# Patient Record
Sex: Male | Born: 1990 | Race: Black or African American | Hispanic: No | Smoking: Current some day smoker
Health system: Southern US, Community
[De-identification: ages and names within clinical notes are randomized; demographics above are authoritative.]

## PROBLEM LIST (undated history)

## (undated) DIAGNOSIS — F909 Attention-deficit hyperactivity disorder, unspecified type: Secondary | ICD-10-CM

## (undated) DIAGNOSIS — S060XAA Concussion with loss of consciousness status unknown, initial encounter: Secondary | ICD-10-CM

## (undated) DIAGNOSIS — S060X9A Concussion with loss of consciousness of unspecified duration, initial encounter: Secondary | ICD-10-CM

## (undated) HISTORY — PX: WRIST FRACTURE SURGERY: SHX121

## (undated) HISTORY — PX: WRIST SURGERY: SHX841

---
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
## 2011-01-11 LAB — POCT HEMOGLOBIN-HEMACUE: Hemoglobin: 14 g/dL (ref 12.0–16.0)

## 2011-10-31 ENCOUNTER — Emergency Department (HOSPITAL_COMMUNITY)
Admission: EM | Admit: 2011-10-31 | Discharge: 2011-10-31 | Disposition: A | Discharge status: Home / Self Care | Payer: Medicaid Other | Attending: Emergency Medicine | Admitting: Emergency Medicine

## 2011-10-31 ENCOUNTER — Encounter: Payer: Self-pay | Admitting: Emergency Medicine

## 2011-10-31 ENCOUNTER — Emergency Department (HOSPITAL_COMMUNITY): Payer: Medicaid Other

## 2011-10-31 DIAGNOSIS — M25579 Pain in unspecified ankle and joints of unspecified foot: Secondary | ICD-10-CM | POA: Insufficient documentation

## 2011-10-31 DIAGNOSIS — Z79899 Other long term (current) drug therapy: Secondary | ICD-10-CM | POA: Insufficient documentation

## 2011-10-31 DIAGNOSIS — M79609 Pain in unspecified limb: Secondary | ICD-10-CM | POA: Insufficient documentation

## 2011-10-31 DIAGNOSIS — X58XXXA Exposure to other specified factors, initial encounter: Secondary | ICD-10-CM | POA: Insufficient documentation

## 2011-10-31 DIAGNOSIS — F909 Attention-deficit hyperactivity disorder, unspecified type: Secondary | ICD-10-CM | POA: Insufficient documentation

## 2011-10-31 DIAGNOSIS — S99921A Unspecified injury of right foot, initial encounter: Secondary | ICD-10-CM

## 2011-10-31 DIAGNOSIS — F17200 Nicotine dependence, unspecified, uncomplicated: Secondary | ICD-10-CM | POA: Insufficient documentation

## 2011-10-31 DIAGNOSIS — Y9361 Activity, american tackle football: Secondary | ICD-10-CM | POA: Insufficient documentation

## 2011-10-31 DIAGNOSIS — S8990XA Unspecified injury of unspecified lower leg, initial encounter: Secondary | ICD-10-CM | POA: Insufficient documentation

## 2011-10-31 HISTORY — DX: Attention-deficit hyperactivity disorder, unspecified type: F90.9

## 2011-10-31 MED ORDER — IBUPROFEN 600 MG PO TABS: 600.0000 mg | ORAL_TABLET | Freq: Four times a day (QID) | ORAL | Status: AC | PRN

## 2011-10-31 NOTE — ED Notes (Signed)
Pt c/o right foot pain after playing football yesterday; pt sts some swelling; pt ambulatory and CMS intact

## 2011-10-31 NOTE — ED Notes (Signed)
Ortho paged. 

## 2011-10-31 NOTE — ED Provider Notes (Signed)
History     CSN: [id number]  Arrival date & time 10/31/11  0740   First MD Initiated Contact with Patient 10/31/11 [phone]      Chief Complaint  Patient presents with  . Foot Pain    (Consider location/radiation/quality/duration/timing/severity/associated sxs/prior treatment) HPI  21 year old male presenting to the ED with chief complaints of right foot pain. Patient states he was playing football yesterday and has noticed pain to this right foot. Patient describes pain as sharp, throbbing. He is able to ambulate but had increasing pain with walking. He denies any specific injury. He denies right knee or ankle pain, and denies numbness or bleeding.  He is having increasing pain this morning.  Past Medical History  Diagnosis Date  . ADHD (attention deficit hyperactivity disorder)     Past Surgical History  Procedure Date  . Wrist fracture surgery     History reviewed. No pertinent family history.  History  Substance Use Topics  . Smoking status: Current Some Day Smoker -- 2.0 packs/day    Types: Cigarettes  . Smokeless tobacco: Not on file  . Alcohol Use: No      Review of Systems  All other systems reviewed and are negative.    Allergies  Review of patient's allergies indicates no known allergies.  Home Medications   Current Outpatient Rx  Name Route Sig Dispense Refill  . METHYLPHENIDATE HCL ER 36 MG PO TBCR Oral Take 36 mg by mouth every morning.      Marland Kitchen MIRTAZAPINE 15 MG PO TABS Oral Take 15 mg by mouth at bedtime.        BP 131/70  Pulse 64  Temp(Src) 97.9 F (36.6 C) (Oral)  Resp 18  SpO2 100%  Physical Exam  Constitutional: He appears well-developed and well-nourished.  HENT:  Head: Normocephalic and atraumatic.  Eyes: Conjunctivae are normal.  Neck: Neck supple.  Musculoskeletal: Normal range of motion. He exhibits tenderness.       Right knee: Normal.       Right ankle: He exhibits normal range of motion, no swelling and no deformity.  tenderness.       Feet:  Neurological: He is alert.  Skin: Skin is warm and dry. No rash noted. No erythema.    ED Course  Procedures (including critical care time)  Labs Reviewed - No data to display No results found.   No diagnosis found.  Results for orders placed during the hospital encounter of 02/09/10  POCT HEMOGLOBIN-HEMACUE      Component Value Range   Hemoglobin 14.0  12.0 - 16.0 (g/dL)   Dg Ankle Complete Right  10/31/2011  *RADIOLOGY REPORT*  Clinical Data: Lateral ankle pain radiating to the top of the foot. Fall.  RIGHT ANKLE - COMPLETE 3+ VIEW  Comparison: None.  Findings: Plafond and talar dome appear intact.  No soft tissue swelling along the malleoli noted.  No malleolar fracture is observed.  IMPRESSION:  1.  No acute bony findings.  If symptoms persist despite conservative therapy, MRI followup may be warranted.  Original Report Authenticated By: Dellia Cloud, M.D.   Dg Foot Complete Right  10/31/2011  *RADIOLOGY REPORT*  Clinical Data: Fall.  Foot and ankle pain.  RIGHT FOOT COMPLETE - 3+ VIEW  Comparison: None.  Findings: Alignment at the Lisfranc joint appears normal.  The base of the fifth metatarsal appears intact.  Hallux valgus noted, with a bifid medial first digit sesamoid.  No metatarsal fracture is observed.  IMPRESSION:  1.  Hallux valgus.   Otherwise, no significant abnormality identified.  Original Report Authenticated By: Dellia Cloud, M.D.      MDM  Will obtain R ankle and foot xray due to pain after playing football.  Pt currently able to ambulate.  Exam is noted for tenderness at R 5th MCP.     9:36 AM Right ankle and foot x-ray reveals no acute fractures or dislocation. Patient is able to date. Ace wrap and postop shoe and order. R.I.C.E therapy discussed. Follow up instruction given. Patient agrees with plan.     Fayrene Helper, [place] 10/31/11 [phone]

## 2011-10-31 NOTE — ED Provider Notes (Signed)
Medical screening examination/treatment/procedure(s) were performed by non-physician practitioner and as supervising physician I was immediately available for consultation/collaboration.  Kaire Stary, MD 10/31/11 1756 

## 2013-03-29 ENCOUNTER — Encounter (HOSPITAL_COMMUNITY): Payer: Self-pay | Admitting: *Deleted

## 2013-03-29 ENCOUNTER — Emergency Department (HOSPITAL_COMMUNITY): Payer: Medicaid Other

## 2013-03-29 ENCOUNTER — Emergency Department (HOSPITAL_COMMUNITY)
Admission: EM | Admit: 2013-03-29 | Discharge: 2013-03-30 | Disposition: A | Discharge status: Home / Self Care | Payer: Medicaid Other | Attending: Emergency Medicine | Admitting: Emergency Medicine

## 2013-03-29 DIAGNOSIS — F17200 Nicotine dependence, unspecified, uncomplicated: Secondary | ICD-10-CM | POA: Insufficient documentation

## 2013-03-29 DIAGNOSIS — Y9289 Other specified places as the place of occurrence of the external cause: Secondary | ICD-10-CM | POA: Insufficient documentation

## 2013-03-29 DIAGNOSIS — IMO0002 Reserved for concepts with insufficient information to code with codable children: Secondary | ICD-10-CM | POA: Insufficient documentation

## 2013-03-29 DIAGNOSIS — S0990XA Unspecified injury of head, initial encounter: Secondary | ICD-10-CM | POA: Insufficient documentation

## 2013-03-29 DIAGNOSIS — S0100XA Unspecified open wound of scalp, initial encounter: Secondary | ICD-10-CM | POA: Insufficient documentation

## 2013-03-29 DIAGNOSIS — Z8659 Personal history of other mental and behavioral disorders: Secondary | ICD-10-CM | POA: Insufficient documentation

## 2013-03-29 DIAGNOSIS — Y9351 Activity, roller skating (inline) and skateboarding: Secondary | ICD-10-CM | POA: Insufficient documentation

## 2013-03-29 DIAGNOSIS — S01501A Unspecified open wound of lip, initial encounter: Secondary | ICD-10-CM | POA: Insufficient documentation

## 2013-03-29 DIAGNOSIS — S01511A Laceration without foreign body of lip, initial encounter: Secondary | ICD-10-CM

## 2013-03-29 LAB — CBC
Hemoglobin: 15.5 g/dL (ref 13.0–17.0)
MCH: 28.8 pg (ref 26.0–34.0)
RBC: 5.39 MIL/uL (ref 4.22–5.81)

## 2013-03-29 MED ORDER — SODIUM CHLORIDE 0.9 % IV BOLUS (SEPSIS)
1000.0000 mL | Freq: Once | INTRAVENOUS | Status: AC
  Administered 2013-03-29: 1000 mL via INTRAVENOUS

## 2013-03-29 NOTE — ED Provider Notes (Signed)
History     CSN: [id number]  Arrival date & time 03/29/13  2046   First MD Initiated Contact with Patient 03/29/13 2051      Chief Complaint  Patient presents with  . Fall    (Consider location/radiation/quality/duration/timing/severity/associated sxs/prior treatment) Patient is a 22 y.o. male presenting with fall.  Fall This is a new problem. The current episode started today. Episode frequency: once. The problem has been unchanged. Associated symptoms include headaches. Pertinent negatives include no abdominal pain, arthralgias, change in bowel habit, chest pain, chills, congestion, coughing, fatigue, fever, joint swelling, nausea, rash, sore throat, urinary symptoms, vertigo, visual change or vomiting. Nothing aggravates the symptoms. He has tried nothing for the symptoms.    Past Medical History  Diagnosis Date  . ADHD (attention deficit hyperactivity disorder)     Past Surgical History  Procedure Laterality Date  . Wrist fracture surgery      No family history on file.  History  Substance Use Topics  . Smoking status: Current Some Day Smoker -- 2.00 packs/day    Types: Cigarettes  . Smokeless tobacco: Not on file  . Alcohol Use: Yes      Review of Systems  Constitutional: Negative for fever, chills and fatigue.  HENT: Negative for congestion, sore throat and rhinorrhea.   Eyes: Negative for photophobia and visual disturbance.  Respiratory: Negative for cough and shortness of breath.   Cardiovascular: Negative for chest pain and leg swelling.  Gastrointestinal: Negative for nausea, vomiting, abdominal pain, diarrhea, constipation and change in bowel habit.  Endocrine: Negative for polydipsia and polyuria.  Genitourinary: Negative for dysuria and hematuria.  Musculoskeletal: Negative for back pain, joint swelling and arthralgias.  Skin: Negative for color change and rash.  Neurological: Positive for headaches. Negative for dizziness, vertigo, syncope and  light-headedness.  Hematological: Negative for adenopathy. Does not bruise/bleed easily.  All other systems reviewed and are negative.    Allergies  Review of patient's allergies indicates no known allergies.  Home Medications   No current outpatient prescriptions on file.  BP 140/75  Pulse 68  Temp(Src) 98.3 F (36.8 C) (Oral)  Resp 15  SpO2 100%  Physical Exam  Vitals reviewed. Constitutional: He is oriented to person, place, and time. He appears well-developed and well-nourished.  HENT:  Head: Normocephalic. Head is with laceration.    Laceration to R inferior interior lip 1cm 5 mm deep, abrasions to face.  No septal hematoma or epistaxis.  Pt has chipped teeth to front central and lateral incisors.  Also with facial tenderness over R maxilla, subjective numbness.  EOMI, PERRL  Eyes: Conjunctivae and EOM are normal. Pupils are equal, round, and reactive to light.  Neck: Normal range of motion. Neck supple.  Cardiovascular: Normal rate, regular rhythm and normal heart sounds.   Pulmonary/Chest: Effort normal and breath sounds normal. No respiratory distress.  Abdominal: He exhibits no distension. There is no tenderness. There is no rebound and no guarding.  Musculoskeletal: Normal range of motion.       Left shoulder: He exhibits tenderness and bony tenderness.       Cervical back: He exhibits tenderness and bony tenderness.       Thoracic back: Normal.       Lumbar back: He exhibits tenderness and bony tenderness.  Abrasions over bil shoulders  Neurological: He is alert and oriented to person, place, and time.  Skin: Skin is warm and dry.    ED Course  LACERATION REPAIR Date/Time: 03/30/2013 2:29 AM  Performed by: Noel Gerold Authorized by: Noel Gerold Consent: Verbal consent obtained. Body area: head/neck Location details: lower lip Full thickness lip laceration: yes Vermillion border involved: yes Lip laceration height: vermillion only Laceration length: 4  cm Foreign bodies: no foreign bodies Tendon involvement: none Nerve involvement: none Anesthesia: local infiltration Local anesthetic: lidocaine 1% without epinephrine Irrigation solution: saline Irrigation method: syringe Amount of cleaning: standard Debridement: none Degree of undermining: none Wound skin closure material used: 3-0 fast gut and 6-0 proline. Wound subcutaneous closure material used: 3-0 fast gut. Technique: simple Approximation: close Approximation difficulty: simple Lip approximation: vermillion border well aligned Patient tolerance: Patient tolerated the procedure well with no immediate complications.   (including critical care time)  Labs Reviewed  CBC - Abnormal; Notable for the following:    Platelets 133 (*)    All other components within normal limits   Dg Thoracic Spine 2 View  03/30/2013   *RADIOLOGY REPORT*  Clinical Data: Fall with mid back pain.  THORACIC SPINE - 2 VIEW  Comparison: None  Findings: Normal alignment is noted. There is no evidence of fracture or subluxation. The disc spaces are maintained. No focal bony lesions are present.  IMPRESSION: No acute bony abnormality.   Original Report Authenticated By: Harmon Pier, M.D.   Dg Lumbar Spine 2-3 Views  03/30/2013   *RADIOLOGY REPORT*  Clinical Data: Low back pain following injury.  LUMBAR SPINE - 2-3 VIEW  Comparison: None  Findings: Frontal and cross-table lateral views of the lumbar spine demonstrate normal alignment without fracture subluxation. The disc spaces are maintained. No focal bony lesions are present.  IMPRESSION: No evidence of acute bony abnormality on this two-view study.   Original Report Authenticated By: Harmon Pier, M.D.   Ct Head Wo Contrast  03/29/2013   *RADIOLOGY REPORT*  Clinical Data:  22 year old male with head, face and neck injury with headache, neck pain and facial pain and swelling.  CT HEAD WITHOUT CONTRAST CT MAXILLOFACIAL WITHOUT CONTRAST CT CERVICAL SPINE WITHOUT  CONTRAST  Technique:  Multidetector CT imaging of the head, cervical spine, and maxillofacial structures were performed using the standard protocol without intravenous contrast. Multiplanar CT image reconstructions of the cervical spine and maxillofacial structures were also generated.  Comparison:  None  CT HEAD  Findings: No intracranial abnormalities are identified, including mass lesion or mass effect, hydrocephalus, extra-axial fluid collection, midline shift, hemorrhage, or acute infarction.  The visualized bony calvarium is unremarkable. Left scalp soft tissue swelling is identified.  IMPRESSION: No evidence of intracranial abnormality.  Left scalp soft tissue swelling without fracture.  CT MAXILLOFACIAL  Findings:  There is no evidence of fracture, subluxation or dislocation. The paranasal sinuses, mastoid air cells and middle/inner ears are clear. The orbits and globes are unremarkable. No focal bony lesions are present. No radiopaque foreign bodies are present.  IMPRESSION: No evidence of acute bony abnormality.  CT CERVICAL SPINE  Findings:   Normal alignment is noted. There is no evidence of fracture, subluxation or prevertebral soft tissue swelling. The disc spaces are maintained. No focal bony lesions are present.  The soft tissue structures are unremarkable.  IMPRESSION: No static evidence of acute injury to the cervical spine.   Original Report Authenticated By: Harmon Pier, M.D.   Ct Cervical Spine Wo Contrast  03/29/2013   *RADIOLOGY REPORT*  Clinical Data:  22 year old male with head, face and neck injury with headache, neck pain and facial pain and swelling.  CT HEAD WITHOUT CONTRAST CT MAXILLOFACIAL WITHOUT CONTRAST  CT CERVICAL SPINE WITHOUT CONTRAST  Technique:  Multidetector CT imaging of the head, cervical spine, and maxillofacial structures were performed using the standard protocol without intravenous contrast. Multiplanar CT image reconstructions of the cervical spine and maxillofacial  structures were also generated.  Comparison:  None  CT HEAD  Findings: No intracranial abnormalities are identified, including mass lesion or mass effect, hydrocephalus, extra-axial fluid collection, midline shift, hemorrhage, or acute infarction.  The visualized bony calvarium is unremarkable. Left scalp soft tissue swelling is identified.  IMPRESSION: No evidence of intracranial abnormality.  Left scalp soft tissue swelling without fracture.  CT MAXILLOFACIAL  Findings:  There is no evidence of fracture, subluxation or dislocation. The paranasal sinuses, mastoid air cells and middle/inner ears are clear. The orbits and globes are unremarkable. No focal bony lesions are present. No radiopaque foreign bodies are present.  IMPRESSION: No evidence of acute bony abnormality.  CT CERVICAL SPINE  Findings:   Normal alignment is noted. There is no evidence of fracture, subluxation or prevertebral soft tissue swelling. The disc spaces are maintained. No focal bony lesions are present.  The soft tissue structures are unremarkable.  IMPRESSION: No static evidence of acute injury to the cervical spine.   Original Report Authenticated By: Harmon Pier, M.D.   Dg Shoulder Left  03/30/2013   *RADIOLOGY REPORT*  Clinical Data: Fall with left shoulder pain.  LEFT SHOULDER - 2+ VIEW  Comparison: None  Findings: There is a horizontal nondisplaced scapular fracture in the infraspinatus region. The glenohumeral and AC joints are unremarkable. There is no evidence of humeral head subluxation or dislocation. No other fractures are present.  IMPRESSION: Nondisplaced scapular fracture.   Original Report Authenticated By: Harmon Pier, M.D.   Ct Maxillofacial Wo Cm  03/29/2013   *RADIOLOGY REPORT*  Clinical Data:  22 year old male with head, face and neck injury with headache, neck pain and facial pain and swelling.  CT HEAD WITHOUT CONTRAST CT MAXILLOFACIAL WITHOUT CONTRAST CT CERVICAL SPINE WITHOUT CONTRAST  Technique:  Multidetector CT  imaging of the head, cervical spine, and maxillofacial structures were performed using the standard protocol without intravenous contrast. Multiplanar CT image reconstructions of the cervical spine and maxillofacial structures were also generated.  Comparison:  None  CT HEAD  Findings: No intracranial abnormalities are identified, including mass lesion or mass effect, hydrocephalus, extra-axial fluid collection, midline shift, hemorrhage, or acute infarction.  The visualized bony calvarium is unremarkable. Left scalp soft tissue swelling is identified.  IMPRESSION: No evidence of intracranial abnormality.  Left scalp soft tissue swelling without fracture.  CT MAXILLOFACIAL  Findings:  There is no evidence of fracture, subluxation or dislocation. The paranasal sinuses, mastoid air cells and middle/inner ears are clear. The orbits and globes are unremarkable. No focal bony lesions are present. No radiopaque foreign bodies are present.  IMPRESSION: No evidence of acute bony abnormality.  CT CERVICAL SPINE  Findings:   Normal alignment is noted. There is no evidence of fracture, subluxation or prevertebral soft tissue swelling. The disc spaces are maintained. No focal bony lesions are present.  The soft tissue structures are unremarkable.  IMPRESSION: No static evidence of acute injury to the cervical spine.   Original Report Authenticated By: Harmon Pier, M.D.     1. Closed head injury, initial encounter   2. Lip laceration, initial encounter       MDM  22 y.o. male  with pertinent PMH of none presents with facial trauma as described above from fall from skateboard.  ?  LOC.  Physical exam and vitals as above.  Will obtain head/face/neck ct, plain films of spine.  Pt initially refuses pain medication.    Labs and imaging as above, no facial fractures, however nondisplaced scapular fracture present on L side.  Laceration to lip through and through, repaired as above.  Family given standard precautions for  concussion, lac repair, and for shoulder fracture.  Mother and son voice understanding and agree to fu.  Pt unsteady on his feet, but ambulates without assistance.  Informed them to fu in 5 days for suture removal from face.    Labs and imaging as above reviewed by myself and attending,Dr. Lynelle Doctor, with whom case was discussed.   1. Closed head injury, initial encounter   2. Lip laceration, initial encounter             Noel Gerold, MD 03/30/13 0230

## 2013-03-29 NOTE — ED Notes (Addendum)
Per [facility] pt was coming downhill on a skateboard and fell trying to turn. Brought in on [facility]. Pt has chipped teeth, abrasions to lft shldr, laceration to lower lip, hematoma to bck of head. Pt also c/o lower bck pain. No helmet. phentanyl given en route. Per pt he "woke up on the ground". Pt A&O X 4.

## 2013-03-29 NOTE — ED Notes (Signed)
Patient transported to X-ray 

## 2013-03-30 MED ORDER — HYDROCODONE-ACETAMINOPHEN 5-325 MG PO TABS: 1.0000 | ORAL_TABLET | Freq: Four times a day (QID) | ORAL | Status: DC | PRN

## 2013-03-30 MED ORDER — FENTANYL CITRATE 0.05 MG/ML IJ SOLN
50.0000 ug | Freq: Once | INTRAMUSCULAR | Status: AC
  Administered 2013-03-30: 50 ug via INTRAVENOUS
  Filled 2013-03-30: qty 2

## 2013-03-30 MED ORDER — LIDOCAINE HCL (PF) 1 % IJ SOLN
5.0000 mL | Freq: Once | INTRAMUSCULAR | Status: AC
  Administered 2013-03-30: 5 mL via INTRADERMAL
  Filled 2013-03-30: qty 5

## 2013-03-30 NOTE — ED Provider Notes (Signed)
I saw and evaluated the patient, reviewed the resident's note and I agree with the findings and plan.  Pt presented to the ED after a fall while skateboarding.  Facial injury without serious head injury.  NO fracture.  Will dc home with pain meds, outpatient follow up.  Celene Kras, MD 03/30/13 [phone]

## 2017-08-16 ENCOUNTER — Emergency Department
Admission: EM | Admit: 2017-08-16 | Discharge: 2017-08-16 | Disposition: A | Discharge status: Home / Self Care | Payer: No Typology Code available for payment source | Attending: Emergency Medicine | Admitting: Emergency Medicine

## 2017-08-16 ENCOUNTER — Emergency Department: Payer: No Typology Code available for payment source

## 2017-08-16 DIAGNOSIS — Y939 Activity, unspecified: Secondary | ICD-10-CM | POA: Insufficient documentation

## 2017-08-16 DIAGNOSIS — S161XXA Strain of muscle, fascia and tendon at neck level, initial encounter: Secondary | ICD-10-CM | POA: Insufficient documentation

## 2017-08-16 DIAGNOSIS — S060X9A Concussion with loss of consciousness of unspecified duration, initial encounter: Secondary | ICD-10-CM | POA: Insufficient documentation

## 2017-08-16 DIAGNOSIS — Y92414 Local residential or business street as the place of occurrence of the external cause: Secondary | ICD-10-CM | POA: Diagnosis not present

## 2017-08-16 DIAGNOSIS — S0181XA Laceration without foreign body of other part of head, initial encounter: Secondary | ICD-10-CM

## 2017-08-16 DIAGNOSIS — Y999 Unspecified external cause status: Secondary | ICD-10-CM | POA: Insufficient documentation

## 2017-08-16 DIAGNOSIS — S01421A Laceration with foreign body of right cheek and temporomandibular area, initial encounter: Secondary | ICD-10-CM | POA: Insufficient documentation

## 2017-08-16 DIAGNOSIS — F17210 Nicotine dependence, cigarettes, uncomplicated: Secondary | ICD-10-CM | POA: Insufficient documentation

## 2017-08-16 DIAGNOSIS — S199XXA Unspecified injury of neck, initial encounter: Secondary | ICD-10-CM | POA: Diagnosis present

## 2017-08-16 MED ORDER — BACITRACIN ZINC 500 UNIT/GM EX OINT
TOPICAL_OINTMENT | CUTANEOUS | Status: AC
  Filled 2017-08-16: qty 0.9

## 2017-08-16 MED ORDER — LIDOCAINE HCL (PF) 1 % IJ SOLN
INTRAMUSCULAR | Status: AC
  Administered 2017-08-16: 5 mL via INTRADERMAL
  Filled 2017-08-16: qty 5

## 2017-08-16 NOTE — ED Notes (Signed)
Signature pad not working pt signed paper copy

## 2017-08-16 NOTE — ED Notes (Signed)
Pt taken to scans via stretcher. c-collar in place.

## 2017-08-16 NOTE — ED Notes (Signed)
Pt appears exteremly intoxicated.
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
## 2017-08-16 NOTE — ED Notes (Signed)
Dr. Stafford at bedside.  
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
## 2017-08-16 NOTE — ED Notes (Signed)
Pt lying on stretcher, sitting up. c-collar in place. Blanket covering pt legs. Appears to be sleeping at this time. Rise and fall of chest noted.

## 2018-05-22 ENCOUNTER — Emergency Department (HOSPITAL_COMMUNITY): Payer: No Typology Code available for payment source

## 2018-05-22 ENCOUNTER — Encounter (HOSPITAL_COMMUNITY): Payer: Self-pay | Admitting: Emergency Medicine

## 2018-05-22 ENCOUNTER — Other Ambulatory Visit: Payer: Self-pay

## 2018-05-22 ENCOUNTER — Emergency Department (HOSPITAL_COMMUNITY)
Admission: EM | Admit: 2018-05-22 | Discharge: 2018-05-22 | Disposition: A | Discharge status: Home / Self Care | Payer: No Typology Code available for payment source | Attending: Emergency Medicine | Admitting: Emergency Medicine

## 2018-05-22 DIAGNOSIS — Y92410 Unspecified street and highway as the place of occurrence of the external cause: Secondary | ICD-10-CM | POA: Diagnosis not present

## 2018-05-22 DIAGNOSIS — F17200 Nicotine dependence, unspecified, uncomplicated: Secondary | ICD-10-CM | POA: Diagnosis not present

## 2018-05-22 DIAGNOSIS — S61411A Laceration without foreign body of right hand, initial encounter: Secondary | ICD-10-CM | POA: Diagnosis not present

## 2018-05-22 DIAGNOSIS — Y9389 Activity, other specified: Secondary | ICD-10-CM | POA: Insufficient documentation

## 2018-05-22 DIAGNOSIS — Y999 Unspecified external cause status: Secondary | ICD-10-CM | POA: Diagnosis not present

## 2018-05-22 DIAGNOSIS — T1490XA Injury, unspecified, initial encounter: Secondary | ICD-10-CM

## 2018-05-22 DIAGNOSIS — S6991XA Unspecified injury of right wrist, hand and finger(s), initial encounter: Secondary | ICD-10-CM | POA: Diagnosis present

## 2018-05-22 HISTORY — DX: Concussion with loss of consciousness status unknown, initial encounter: S06.0XAA

## 2018-05-22 HISTORY — DX: Concussion with loss of consciousness of unspecified duration, initial encounter: S06.0X9A

## 2018-05-22 MED ORDER — LIDOCAINE HCL (PF) 1 % IJ SOLN
5.0000 mL | Freq: Once | INTRAMUSCULAR | Status: AC
  Administered 2018-05-22: 5 mL via INTRADERMAL
  Filled 2018-05-22: qty 5

## 2018-05-22 MED ORDER — CEPHALEXIN 500 MG PO CAPS: 500.0000 mg | ORAL_CAPSULE | Freq: Four times a day (QID) | ORAL | 0 refills | Status: DC

## 2018-05-22 NOTE — Discharge Instructions (Addendum)
Recommend tylenol/ibuprofen as needed for pain. Return if symptoms worsen. Gently rinse right hand wounds 2 times daily and wrap with neosporin or bacitracin and gauze. Follow up with hand surgery team or your doctor in 7days for reevaluation and suture removal. Take all antibiotics as prescribed.
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
## 2018-05-22 NOTE — ED Notes (Signed)
Patient returned from radiology, no distress noted
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
## 2018-05-22 NOTE — ED Notes (Signed)
Patient Alert and oriented to baseline. Stable and ambulatory to baseline. Patient verbalized understanding of the discharge instructions.  Patient belongings were taken by the patient.   
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
## 2018-05-23 ENCOUNTER — Encounter (HOSPITAL_COMMUNITY): Payer: Self-pay | Admitting: *Deleted

## 2018-10-10 ENCOUNTER — Encounter (HOSPITAL_COMMUNITY): Payer: Self-pay

## 2018-10-10 ENCOUNTER — Ambulatory Visit (HOSPITAL_COMMUNITY)
Admission: EM | Admit: 2018-10-10 | Discharge: 2018-10-10 | Disposition: A | Discharge status: Home / Self Care | Payer: Self-pay | Attending: Family Medicine | Admitting: Family Medicine

## 2018-10-10 DIAGNOSIS — J00 Acute nasopharyngitis [common cold]: Secondary | ICD-10-CM

## 2018-10-10 DIAGNOSIS — R197 Diarrhea, unspecified: Secondary | ICD-10-CM | POA: Insufficient documentation

## 2018-10-10 DIAGNOSIS — Z79899 Other long term (current) drug therapy: Secondary | ICD-10-CM | POA: Insufficient documentation

## 2018-10-10 DIAGNOSIS — F17210 Nicotine dependence, cigarettes, uncomplicated: Secondary | ICD-10-CM | POA: Insufficient documentation

## 2018-10-10 DIAGNOSIS — R1011 Right upper quadrant pain: Secondary | ICD-10-CM | POA: Insufficient documentation

## 2018-10-10 LAB — POCT RAPID STREP A: Streptococcus, Group A Screen (Direct): NEGATIVE

## 2018-10-10 MED ORDER — DICYCLOMINE HCL 20 MG PO TABS: 20.0000 mg | ORAL_TABLET | Freq: Two times a day (BID) | ORAL | 0 refills | Status: DC

## 2018-10-10 MED ORDER — PREDNISONE 50 MG PO TABS: 50.0000 mg | ORAL_TABLET | Freq: Every day | ORAL | 0 refills | Status: DC

## 2018-10-10 MED ORDER — IPRATROPIUM BROMIDE 0.06 % NA SOLN: 2.0000 | Freq: Four times a day (QID) | NASAL | 0 refills | Status: DC

## 2018-10-10 MED ORDER — FLUTICASONE PROPIONATE 50 MCG/ACT NA SUSP: 2.0000 | Freq: Every day | NASAL | 0 refills | Status: DC

## 2018-10-10 NOTE — ED Triage Notes (Signed)
Pt presents with generalized abdominal pain with some diarrhea and sore throat.

## 2018-10-10 NOTE — Discharge Instructions (Signed)
Rapid strep negative. Symptoms are most likely due to viral illness/ drainage down your throat. Flonase, atrovent for nasal congestion/drainage. You can use over the counter nasal saline rinse such as neti pot for nasal congestion. Monitor for any worsening of symptoms, swelling of the throat, trouble breathing, trouble swallowing, leaning forward to breath, drooling, go to the emergency department for further evaluation needed.  For sore throat/cough try using a honey-based tea. Use 3 teaspoons of honey with juice squeezed from half lemon. Place shaved pieces of ginger into 1/2-1 cup of water and warm over stove top. Then mix the ingredients and repeat every 4 hours as needed.  Bentyl as directed. Keep hydrated, you urine should be clear to pale yellow in color. Bland diet, advance as tolerated. Monitor for any worsening of symptoms, nausea or vomiting not controlled by medication, worsening abdominal pain, fever, follow-up for reevaluation.
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
## 2018-10-12 LAB — CULTURE, GROUP A STREP (THRC)

## 2018-10-15 ENCOUNTER — Telehealth (HOSPITAL_COMMUNITY): Payer: Self-pay | Admitting: Emergency Medicine

## 2018-10-15 NOTE — Telephone Encounter (Signed)
Culture is positive for non group A Strep germ.  This is a finding of uncertain significance; not the typical 'strep throat' germ.  Attempted to reach patient. No answer at this time.  

## 2018-10-24 ENCOUNTER — Emergency Department (HOSPITAL_COMMUNITY)
Admission: EM | Admit: 2018-10-24 | Discharge: 2018-10-24 | Disposition: A | Discharge status: Home / Self Care | Payer: Medicaid Other | Attending: Emergency Medicine | Admitting: Emergency Medicine

## 2018-10-24 ENCOUNTER — Emergency Department (HOSPITAL_COMMUNITY): Payer: Medicaid Other

## 2018-10-24 DIAGNOSIS — Y939 Activity, unspecified: Secondary | ICD-10-CM | POA: Insufficient documentation

## 2018-10-24 DIAGNOSIS — S62344A Nondisplaced fracture of base of fourth metacarpal bone, right hand, initial encounter for closed fracture: Secondary | ICD-10-CM | POA: Insufficient documentation

## 2018-10-24 DIAGNOSIS — F17210 Nicotine dependence, cigarettes, uncomplicated: Secondary | ICD-10-CM | POA: Insufficient documentation

## 2018-10-24 DIAGNOSIS — S63264A Dislocation of metacarpophalangeal joint of right ring finger, initial encounter: Secondary | ICD-10-CM

## 2018-10-24 DIAGNOSIS — S63064A Dislocation of metacarpal (bone), proximal end of right hand, initial encounter: Secondary | ICD-10-CM | POA: Insufficient documentation

## 2018-10-24 DIAGNOSIS — S63266A Dislocation of metacarpophalangeal joint of right little finger, initial encounter: Secondary | ICD-10-CM

## 2018-10-24 DIAGNOSIS — Y929 Unspecified place or not applicable: Secondary | ICD-10-CM | POA: Insufficient documentation

## 2018-10-24 DIAGNOSIS — S62346A Nondisplaced fracture of base of fifth metacarpal bone, right hand, initial encounter for closed fracture: Secondary | ICD-10-CM | POA: Insufficient documentation

## 2018-10-24 DIAGNOSIS — X838XXA Intentional self-harm by other specified means, initial encounter: Secondary | ICD-10-CM | POA: Insufficient documentation

## 2018-10-24 DIAGNOSIS — IMO0001 Reserved for inherently not codable concepts without codable children: Secondary | ICD-10-CM

## 2018-10-24 DIAGNOSIS — Y998 Other external cause status: Secondary | ICD-10-CM | POA: Insufficient documentation

## 2018-10-24 LAB — TYPE AND SCREEN
ABO/RH(D): A POS
Antibody Screen: NEGATIVE

## 2018-10-24 LAB — ABO/RH: ABO/RH(D): A POS

## 2018-10-24 MED ORDER — OXYCODONE HCL 5 MG PO TABS: 0.0000 mg | ORAL_TABLET | Freq: Four times a day (QID) | ORAL | 0 refills | Status: DC | PRN

## 2018-10-24 MED ORDER — ACETAMINOPHEN 325 MG PO TABS: 650.0000 mg | ORAL_TABLET | Freq: Four times a day (QID) | ORAL | Status: DC

## 2018-10-24 MED ORDER — IBUPROFEN 200 MG PO TABS: 600.0000 mg | ORAL_TABLET | Freq: Four times a day (QID) | ORAL | Status: DC

## 2018-10-24 NOTE — ED Provider Notes (Signed)
[facility] EMERGENCY DEPARTMENT Provider Note   CSN: [id number] Arrival date & time: 10/24/18  0623     History   Chief Complaint Chief Complaint  Patient presents with  . Hand Injury    HPI Erik Benton is a 28 y.o. male with a history of right wrist surgery who presents to the emergency department with a chief complaint of right hand injury with an obvious deformity.  The injury occurred after the patient punched a wall.  Pain is constant. It is worse when he moves his hand.  He reports that he was drinking alcohol last night.  He is unsure how much she drank or when he stopped drinking.  He is unsure of the last time he ate or drank.  States "I may have used a little cocaine last night."  He is right-hand dominant.  Level 5 caveat as the patient is still actively intoxicated.  The history is provided by the patient. No language interpreter was used.    Past Medical History:  Diagnosis Date  . ADHD (attention deficit hyperactivity disorder)   . Concussion     Patient Active Problem List   Diagnosis Date Noted  . ADHD (attention deficit hyperactivity disorder)     Past Surgical History:  Procedure Laterality Date  . WRIST FRACTURE SURGERY    . WRIST SURGERY          Home Medications    Prior to Admission medications   Medication Sig Start Date End Date Taking? Authorizing Provider  acetaminophen (TYLENOL) 325 MG tablet Take 2 tablets (650 mg total) by mouth every 6 (six) hours. 10/24/18   Mack Hook, MD  ibuprofen (ADVIL) 200 MG tablet Take 3 tablets (600 mg total) by mouth every 6 (six) hours. 10/24/18   Mack Hook, MD  oxyCODONE (ROXICODONE) 5 MG immediate release tablet Take 0-1 tablets (0-5 mg total) by mouth every 6 (six) hours as needed for severe pain. 10/24/18   Mack Hook, MD    Family History No family history on file.  Social History Social History   Tobacco Use  . Smoking status: Current Some Day Smoker   Packs/day: 2.00    Types: Cigarettes  . Smokeless tobacco: Never Used  Substance Use Topics  . Alcohol use: Yes  . Drug use: Not Currently    Types: Marijuana     Allergies   Patient has no known allergies.   Review of Systems Review of Systems  Unable to perform ROS: Mental status change  Eyes: Positive for redness.  Musculoskeletal: Positive for arthralgias and myalgias.   Physical Exam Updated Vital Signs BP 121/73 (BP Location: Left Arm)   Pulse 82   Temp 97.9 F (36.6 C) (Oral)   Resp 18   SpO2 97%   Physical Exam Vitals signs and nursing note reviewed.  Constitutional:      Appearance: He is well-developed.     Comments: Strong odor of alcohol.  HENT:     Head: Normocephalic.  Eyes:     Conjunctiva/sclera: Conjunctivae normal.     Comments: Bilateral eyes are injected.  Neck:     Musculoskeletal: Neck supple.  Cardiovascular:     Rate and Rhythm: Normal rate and regular rhythm.     Heart sounds: No murmur.  Pulmonary:     Effort: Pulmonary effort is normal.  Abdominal:     General: There is no distension.     Palpations: Abdomen is soft.  Musculoskeletal:  General: Swelling, tenderness, deformity and signs of injury present.     Comments: Obvious deformity to the right hand.  Diffusely tender to palpation to the dorsum of the hand.  Sensation is intact.  Radial pulses are 2+ and symmetric.  Decreased range of motion and strength against resistance of digits 2 through 5.  No tenderness to the right wrist.  Skin:    General: Skin is warm and dry.  Neurological:     Mental Status: He is alert.     Comments: Somnolent, but arousable to voice.   Psychiatric:        Behavior: Behavior normal.       ED Treatments / Results  Labs (all labs ordered are listed, but only abnormal results are displayed) Labs Reviewed  TYPE AND SCREEN  ABO/RH    EKG None  Radiology Dg Wrist Complete Right  Result Date: 10/24/2018 CLINICAL DATA:  Punched wall  with right hand, with right wrist pain. Initial encounter. EXAM: RIGHT WRIST - COMPLETE 3+ VIEW COMPARISON:  Right hand radiographs performed 05/22/2018 FINDINGS: There are fractures of the bases of the fourth and fifth metacarpals, comminuted at the fourth metacarpal, with dorsal dislocation of the fourth and fifth metacarpals. Overlying soft tissue swelling is noted. Pins are noted at the scaphoid. The carpal rows are intact, and demonstrate normal alignment. IMPRESSION: Fractures of the bases of the fourth and fifth metacarpals, comminuted at the fourth metacarpal, with dorsal dislocation of the fourth and fifth metacarpals. Electronically Signed   By: Roanna RaiderJeffery  Chang M.D.   On: 10/24/2018 07:01   Dg Hand Complete Right  Result Date: 10/24/2018 CLINICAL DATA:  Punched wall with right hand, with pain about the fourth and fifth fingers, and associated soft tissue swelling. Initial encounter. EXAM: RIGHT HAND - COMPLETE 3+ VIEW COMPARISON:  Right hand radiographs performed 05/22/2018 FINDINGS: There are fractures of the bases of the fourth and fifth metacarpals, comminuted at the fourth metacarpal, with dorsal dislocation of the fourth and fifth metacarpals. Overlying soft tissue swelling is noted. No additional fractures are seen. Pins are noted at the scaphoid. IMPRESSION: Fractures of the bases of the fourth and fifth metacarpals, comminuted at the fourth metacarpal, with dorsal dislocation of the fourth and fifth metacarpals. Electronically Signed   By: Roanna RaiderJeffery  Chang M.D.   On: 10/24/2018 07:02    Procedures Procedures (including critical care time)  Medications Ordered in ED Medications - No data to display   Initial Impression / Assessment and Plan / ED Course  I have reviewed the triage vital signs and the nursing notes.  Pertinent labs & imaging results that were available during my care of the patient were reviewed by me and considered in my medical decision making (see chart for  details).      28 year old male with a history of right wrist surgery presenting with obvious right hand deformity after punching a wall.  On exam, no evidence of compartment syndrome.  Radial pulses are 2+ and symmetric.  The patient is intoxicated.    X-rays with fractures of the fourth and fifth metacarpals.  The fourth metacarpal fracture is comminuted.  There is a dorsal dislocation of the fourth and fifth metacarpals.  Consulted orthopedic surgery and spoke with Dr. Janee Mornhompson.  The patient is unsure of when he last ate or drank.  He is not accompanied by anyone to the ER.  He has been in the ER for 3 hours, but there is concern about how long he has been  n.p.o.  Dr. Janee Morn recommends at bedside closed reduction and splinting.  Reduction was performed under fluoroscopy.  He was placed in an ulnar gutter splint.  Dr. Carollee Massed office will call him for follow-up.  He should have repeat x-rays of the right hand in the splint at that time.  Will plan to discharge the patient home after he metabolizes alcohol and is clinically sober.  4:10 PM-patient ambulated by me.  No ataxia.  He is requesting something to drink.  Discussed with RN who will give the patient a sandwich and something to drink prior to discharge.  Final Clinical Impressions(s) / ED Diagnoses   Final diagnoses:  Closed dislocation of fifth metacarpal bone of right hand  Closed dislocation of fourth metacarpal bone of right hand  Nondisplaced fracture of base of fifth metacarpal bone, right hand, initial encounter for closed fracture  Closed nondisplaced fracture of base of fourth metacarpal bone of right hand, initial encounter    ED Discharge Orders         Ordered    acetaminophen (TYLENOL) 325 MG tablet  Every 6 hours     10/24/18 1055    ibuprofen (ADVIL) 200 MG tablet  Every 6 hours     10/24/18 1055    oxyCODONE (ROXICODONE) 5 MG immediate release tablet  Every 6 hours PRN     10/24/18 1055            Jovontae Banko A, PA-C 10/24/18 1623    Mesner, Barbara Cower, MD 10/25/18 [phone]

## 2018-10-24 NOTE — Progress Notes (Signed)
Orthopedic Tech Progress Note Patient Details:  Erik Benton [birth year] [id number]  Ortho Devices Type of Ortho Device: Ulna gutter splint Ortho Device/Splint Location: rue. assisted dr with plaster splint application. Ortho Device/Splint Interventions: Ordered, Application, Adjustment   Post Interventions Patient Tolerated: Well Instructions Provided: Care of device, Adjustment of device   Trinna Post 10/24/2018, 11:49 AM

## 2018-10-24 NOTE — ED Notes (Signed)
Pt discharge instructions given. Pt verbalized understanding.  
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
## 2018-10-24 NOTE — Discharge Instructions (Addendum)
KEEP YOUR SPLINT ON!!   Closed Reduction for Metacarpal Dislocation  Metacarpal dislocation is an injury in which a bone in your hand (metacarpal) is moved out of position. Metacarpals are the bones that extend from your knuckles to your wrist. In each hand, you have five metacarpal bones that connect your fingers and your thumb to your wrist. A closed reduction is a procedure to properly align bones that have moved out of place. A closed reduction is not a surgery. It is done without cutting your skin open. During a closed reduction, a health care provider uses pressure and rotation to put bones back into place. Tell a health care provider about:  Any allergies you have.  All medicines you are taking, including vitamins, herbs, eye drops, creams, and over-the-counter medicines.  Any problems you or family members have had with anesthetic medicines.  Any blood disorders you have.  Any surgeries you have had.  Any medical conditions you have.  Whether you are pregnant or may be pregnant.  Any other areas of your hand, arm, or shoulder that are tender or painful. What are the risks? Generally, this is a safe procedure. However, problems may occur, including:  Allergic reactions to medicines.  Damage to other structures or organs.  Malalignment. This is when the bones are not lined up in the right position. What happens before the procedure?  Follow instructions from your health care provider about eating or drinking restrictions.  Ask your health care provider about changing or stopping your regular medicines. This is especially important if you are taking diabetes medicines or blood thinners.  Plan to have someone take you home after the procedure.  If you go home right after the procedure, plan to have someone with you for 24 hours.  You may have an exam or imaging tests done, including: ? X-ray. ? CT scan. What happens during the procedure?  You will be given one or  more of the following: ? A medicine to help you relax (sedative). ? A medicine to numb the area (local anesthetic). ? A medicine to numb the area of the dislocation (hematoma block).  Your health care provider will pull and push your misaligned bones to put them back into place.  An X-ray may be done to check the alignment of your bones and see if the procedure was successful.  A splint or cast will be applied to keep the bones in place (immobilized) while your injury heals. The procedure may vary among health care providers and hospitals. What happens after the procedure?  Do not drive for 24 hours if you received a sedative.  Your blood pressure, heart rate, breathing rate, and blood oxygen level will be monitored often until the medicines you were given have worn off. This information is not intended to replace advice given to you by your health care provider. Make sure you discuss any questions you have with your health care provider. Document Released: 07/04/2012 Document Revised: 03/17/2016 Document Reviewed: 08/19/2015 Elsevier Interactive Patient Education  2019 [employer].
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
## 2019-07-19 ENCOUNTER — Ambulatory Visit (HOSPITAL_COMMUNITY)
Admission: EM | Admit: 2019-07-19 | Discharge: 2019-07-19 | Disposition: A | Discharge status: Home / Self Care | Payer: Self-pay | Attending: Emergency Medicine | Admitting: Emergency Medicine

## 2019-07-19 ENCOUNTER — Encounter (HOSPITAL_COMMUNITY): Payer: Self-pay

## 2019-07-19 ENCOUNTER — Other Ambulatory Visit: Payer: Self-pay

## 2019-07-19 DIAGNOSIS — R300 Dysuria: Secondary | ICD-10-CM | POA: Insufficient documentation

## 2019-07-19 DIAGNOSIS — Z202 Contact with and (suspected) exposure to infections with a predominantly sexual mode of transmission: Secondary | ICD-10-CM | POA: Insufficient documentation

## 2019-07-19 LAB — POCT URINALYSIS DIP (DEVICE)
Bilirubin Urine: NEGATIVE
Glucose, UA: NEGATIVE mg/dL
Hgb urine dipstick: NEGATIVE
Ketones, ur: NEGATIVE mg/dL
Nitrite: NEGATIVE
Protein, ur: NEGATIVE mg/dL
Specific Gravity, Urine: 1.02 (ref 1.005–1.030)
Urobilinogen, UA: 0.2 mg/dL (ref 0.0–1.0)
pH: 7 (ref 5.0–8.0)

## 2019-07-19 MED ORDER — AZITHROMYCIN 250 MG PO TABS
1000.0000 mg | ORAL_TABLET | Freq: Once | ORAL | Status: AC
  Administered 2019-07-19: 11:00:00 1000 mg via ORAL

## 2019-07-19 MED ORDER — AZITHROMYCIN 250 MG PO TABS
ORAL_TABLET | ORAL | Status: AC
  Filled 2019-07-19: qty 4

## 2019-07-19 NOTE — ED Triage Notes (Signed)
Patient report he is having dysuria and his  girlfriend tested positive for Chlamydia, he wants a STD test.

## 2019-07-19 NOTE — ED Provider Notes (Signed)
[facility]    CSN: [id number] Arrival date & time: 07/19/19  7564      History   Chief Complaint Chief Complaint  Patient presents with  . Exposure to STD    HPI RYO KLANG is a 28 y.o. male.   Erik Benton presents with complaints of pain with urination which has been ongoing for at least a few days now. His male partner tested positive for chlamydia a few days ago. They do not use condoms regularly. He doesn't have any other partners. Denies any penile discharge. Denies any sores, lesions, redness or swelling to genitalia. No pelvic pain or back pain. Denies any previous STDs. Denies blood in urine. Without contributing medical history.      ROS per HPI, negative if not otherwise mentioned.      Past Medical History:  Diagnosis Date  . ADHD (attention deficit hyperactivity disorder)   . Concussion     Patient Active Problem List   Diagnosis Date Noted  . ADHD (attention deficit hyperactivity disorder)     Past Surgical History:  Procedure Laterality Date  . WRIST FRACTURE SURGERY    . WRIST SURGERY         Home Medications    Prior to Admission medications   Medication Sig Start Date End Date Taking? Authorizing Provider  acetaminophen (TYLENOL) 325 MG tablet Take 2 tablets (650 mg total) by mouth every 6 (six) hours. 10/24/18   Milly Jakob, MD  ibuprofen (ADVIL) 200 MG tablet Take 3 tablets (600 mg total) by mouth every 6 (six) hours. 10/24/18   Milly Jakob, MD  oxyCODONE (ROXICODONE) 5 MG immediate release tablet Take 0-1 tablets (0-5 mg total) by mouth every 6 (six) hours as needed for severe pain. 10/24/18   Milly Jakob, MD    Family History No family history on file.  Social History Social History   Tobacco Use  . Smoking status: Current Some Day Smoker    Packs/day: 2.00    Types: Cigarettes  . Smokeless tobacco: Never Used  Substance Use Topics  . Alcohol use: Yes  . Drug use: Not Currently    Types:  Marijuana     Allergies   Patient has no known allergies.   Review of Systems Review of Systems   Physical Exam Triage Vital Signs ED Triage Vitals  Enc Vitals Group     BP 07/19/19 1015 130/79     Pulse Rate 07/19/19 1015 76     Resp 07/19/19 1015 14     Temp 07/19/19 1015 98 F (36.7 C)     Temp Source 07/19/19 1015 Oral     SpO2 --      Weight --      Height --      Head Circumference --      Peak Flow --      Pain Score 07/19/19 1014 8     Pain Loc --      Pain Edu? --      Excl. in [place]? --    No data found.  Updated Vital Signs BP 130/79 (BP Location: Left Arm)   Pulse 76   Temp 98 F (36.7 C) (Oral)   Resp 14    Physical Exam Constitutional:      Appearance: He is well-developed.  Cardiovascular:     Rate and Rhythm: Normal rate.  Pulmonary:     Effort: Pulmonary effort is normal.  Abdominal:     Palpations: Abdomen is  soft. Abdomen is not rigid.     Tenderness: There is no abdominal tenderness. There is no guarding or rebound. Negative signs include Murphy's sign and McBurney's sign.     Comments: Denies scrotal redness, swelling, pain; denies sores or lesions; gu exam deferred   Skin:    General: Skin is warm and dry.  Neurological:     Mental Status: He is alert and oriented to person, place, and time.      UC Treatments / Results  Labs (all labs ordered are listed, but only abnormal results are displayed) Labs Reviewed  POCT URINALYSIS DIP (DEVICE) - Abnormal; Notable for the following components:      Result Value   Leukocytes,Ua SMALL (*)    All other components within normal limits  URINE CULTURE  CYTOLOGY, (ORAL, ANAL, URETHRAL) ANCILLARY ONLY    EKG   Radiology No results found.  Procedures Procedures (including critical care time)  Medications Ordered in UC Medications  azithromycin (ZITHROMAX) tablet 1,000 mg (has no administration in time range)    Initial Impression / Assessment and Plan / UC Course  I have  reviewed the triage vital signs and the nursing notes.  Pertinent labs & imaging results that were available during my care of the patient were reviewed by me and considered in my medical decision making (see chart for details).     Empiric azithromycin given at today's visit with cytology pending, urine culture also pending. Safe sex encouraged. Will notify of any positive findings and if any changes to treatment are needed.  If symptoms worsen or do not improve in the next week to return to be seen or to follow up with PCP.  Patient verbalized understanding and agreeable to plan.   Final Clinical Impressions(s) / UC Diagnoses   Final diagnoses:  Exposure to STD  Dysuria     Discharge Instructions     We have treated you today for chlamydia.  Please withhold from intercourse for the next week. Please use condoms to prevent STD's.   Will notify of any positive findings from your testing and if any changes to treatment are needed.     ED Prescriptions    None     PDMP not reviewed this encounter.   Georgetta Haber, NP 07/19/19 1038

## 2019-07-19 NOTE — Discharge Instructions (Signed)
We have treated you today for chlamydia.  Please withhold from intercourse for the next week. Please use condoms to prevent STD's.   Will notify of any positive findings from your testing and if any changes to treatment are needed.

## 2019-07-20 LAB — URINE CULTURE: Culture: NO GROWTH

## 2019-07-20 LAB — CYTOLOGY, (ORAL, ANAL, URETHRAL) ANCILLARY ONLY
Chlamydia: POSITIVE — AB
Neisseria Gonorrhea: NEGATIVE
Trichomonas: NEGATIVE

## 2019-07-22 ENCOUNTER — Telehealth (HOSPITAL_COMMUNITY): Payer: Self-pay | Admitting: Emergency Medicine

## 2019-07-22 NOTE — Telephone Encounter (Signed)
Chlamydia is positive.  This was treated at the urgent care visit with po zithromax 1g.  Pt needs education to please refrain from sexual intercourse for 7 days to give the medicine time to work.  Sexual partners need to be notified and tested/treated.  Condoms may reduce risk of reinfection.  Recheck or followup with PCP for further evaluation if symptoms are not improving.  [facility] notified.  Patient contacted and made aware of    results, all questions answered   
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
## 2021-05-19 ENCOUNTER — Emergency Department (HOSPITAL_COMMUNITY): Payer: Self-pay

## 2021-05-19 ENCOUNTER — Other Ambulatory Visit: Payer: Self-pay

## 2021-05-19 ENCOUNTER — Observation Stay (HOSPITAL_COMMUNITY): Payer: Self-pay

## 2021-05-19 ENCOUNTER — Observation Stay (HOSPITAL_COMMUNITY)
Admission: EM | Admit: 2021-05-19 | Discharge: 2021-05-20 | Disposition: A | Discharge status: Home / Self Care | Payer: Self-pay | Attending: Internal Medicine | Admitting: Internal Medicine

## 2021-05-19 DIAGNOSIS — T1490XA Injury, unspecified, initial encounter: Secondary | ICD-10-CM

## 2021-05-19 DIAGNOSIS — R404 Transient alteration of awareness: Secondary | ICD-10-CM

## 2021-05-19 DIAGNOSIS — Z20822 Contact with and (suspected) exposure to covid-19: Secondary | ICD-10-CM | POA: Insufficient documentation

## 2021-05-19 DIAGNOSIS — L03811 Cellulitis of head [any part, except face]: Secondary | ICD-10-CM | POA: Insufficient documentation

## 2021-05-19 DIAGNOSIS — S0100XD Unspecified open wound of scalp, subsequent encounter: Secondary | ICD-10-CM

## 2021-05-19 DIAGNOSIS — F17210 Nicotine dependence, cigarettes, uncomplicated: Secondary | ICD-10-CM | POA: Insufficient documentation

## 2021-05-19 DIAGNOSIS — F0781 Postconcussional syndrome: Secondary | ICD-10-CM

## 2021-05-19 DIAGNOSIS — R4182 Altered mental status, unspecified: Principal | ICD-10-CM | POA: Diagnosis present

## 2021-05-19 DIAGNOSIS — R079 Chest pain, unspecified: Secondary | ICD-10-CM

## 2021-05-19 DIAGNOSIS — I639 Cerebral infarction, unspecified: Secondary | ICD-10-CM | POA: Insufficient documentation

## 2021-05-19 DIAGNOSIS — Y900 Blood alcohol level of less than 20 mg/100 ml: Secondary | ICD-10-CM | POA: Insufficient documentation

## 2021-05-19 LAB — DIFFERENTIAL
Abs Immature Granulocytes: 0.02 10*3/uL (ref 0.00–0.07)
Basophils Absolute: 0 10*3/uL (ref 0.0–0.1)
Basophils Relative: 0 %
Eosinophils Absolute: 0.4 10*3/uL (ref 0.0–0.5)
Eosinophils Relative: 7 %
Immature Granulocytes: 0 %
Lymphocytes Relative: 24 %
Lymphs Abs: 1.4 10*3/uL (ref 0.7–4.0)
Monocytes Absolute: 0.7 10*3/uL (ref 0.1–1.0)
Monocytes Relative: 12 %
Neutro Abs: 3.2 10*3/uL (ref 1.7–7.7)
Neutrophils Relative %: 57 %

## 2021-05-19 LAB — COMPREHENSIVE METABOLIC PANEL
ALT: 21 U/L (ref 0–44)
AST: 30 U/L (ref 15–41)
Albumin: 3.3 g/dL — ABNORMAL LOW (ref 3.5–5.0)
Alkaline Phosphatase: 67 U/L (ref 38–126)
Anion gap: 6 (ref 5–15)
BUN: 12 mg/dL (ref 6–20)
CO2: 29 mmol/L (ref 22–32)
Calcium: 8.7 mg/dL — ABNORMAL LOW (ref 8.9–10.3)
Chloride: 103 mmol/L (ref 98–111)
Creatinine, Ser: 1.23 mg/dL (ref 0.61–1.24)
GFR, Estimated: >60 mL/min (ref >60)
Glucose, Bld: 97 mg/dL (ref 70–99)
Potassium: 4.4 mmol/L (ref 3.5–5.1)
Sodium: 138 mmol/L (ref 135–145)
Total Bilirubin: 0.7 mg/dL (ref 0.3–1.2)
Total Protein: 5.9 g/dL — ABNORMAL LOW (ref 6.5–8.1)

## 2021-05-19 LAB — TSH: TSH: 0.719 u[IU]/mL (ref 0.350–4.500)

## 2021-05-19 LAB — CBC
HCT: 40.8 % (ref 39.0–52.0)
Hemoglobin: 13.4 g/dL (ref 13.0–17.0)
MCH: 29.6 pg (ref 26.0–34.0)
MCHC: 32.8 g/dL (ref 30.0–36.0)
MCV: 90.3 fL (ref 80.0–100.0)
Platelets: 192 10*3/uL (ref 150–400)
RBC: 4.52 MIL/uL (ref 4.22–5.81)
RDW: 12.9 % (ref 11.5–15.5)
WBC: 5.8 10*3/uL (ref 4.0–10.5)
nRBC: 0 % (ref 0.0–0.2)

## 2021-05-19 LAB — I-STAT CHEM 8, ED
BUN: 14 mg/dL (ref 6–20)
Calcium, Ion: 1.09 mmol/L — ABNORMAL LOW (ref 1.15–1.40)
Chloride: 103 mmol/L (ref 98–111)
Creatinine, Ser: 1.2 mg/dL (ref 0.61–1.24)
Glucose, Bld: 97 mg/dL (ref 70–99)
HCT: 39 % (ref 39.0–52.0)
Hemoglobin: 13.3 g/dL (ref 13.0–17.0)
Potassium: 4.4 mmol/L (ref 3.5–5.1)
Sodium: 139 mmol/L (ref 135–145)
TCO2: 31 mmol/L (ref 22–32)

## 2021-05-19 LAB — CBG MONITORING, ED: Glucose-Capillary: 88 mg/dL (ref 70–99)

## 2021-05-19 LAB — ETHANOL: Alcohol, Ethyl (B): <10 mg/dL (ref <10)

## 2021-05-19 LAB — APTT: aPTT: 32 seconds (ref 24–36)

## 2021-05-19 LAB — HIV ANTIBODY (ROUTINE TESTING W REFLEX): HIV Screen 4th Generation wRfx: NONREACTIVE

## 2021-05-19 LAB — PROTIME-INR
INR: 1 (ref 0.8–1.2)
Prothrombin Time: 13 seconds (ref 11.4–15.2)

## 2021-05-19 MED ORDER — SULFAMETHOXAZOLE-TRIMETHOPRIM 800-160 MG PO TABS
1.0000 | ORAL_TABLET | Freq: Two times a day (BID) | ORAL | Status: DC
  Administered 2021-05-19 – 2021-05-20 (×3): 1 via ORAL
  Filled 2021-05-19 (×3): qty 1

## 2021-05-19 MED ORDER — IOHEXOL 350 MG/ML SOLN
75.0000 mL | Freq: Once | INTRAVENOUS | Status: AC | PRN
  Administered 2021-05-19: 75 mL via INTRAVENOUS

## 2021-05-19 MED ORDER — SILVER SULFADIAZINE 1 % EX CREA
TOPICAL_CREAM | Freq: Two times a day (BID) | CUTANEOUS | Status: DC
  Filled 2021-05-19 (×2): qty 85

## 2021-05-19 MED ORDER — CEPHALEXIN 250 MG PO CAPS
500.0000 mg | ORAL_CAPSULE | Freq: Once | ORAL | Status: AC
  Administered 2021-05-19: 500 mg via ORAL
  Filled 2021-05-19: qty 2

## 2021-05-19 MED ORDER — ACETAMINOPHEN 650 MG RE SUPP: 650.0000 mg | Freq: Four times a day (QID) | RECTAL | Status: DC | PRN

## 2021-05-19 MED ORDER — ENOXAPARIN SODIUM 40 MG/0.4ML IJ SOSY
40.0000 mg | PREFILLED_SYRINGE | INTRAMUSCULAR | Status: DC
  Administered 2021-05-19 – 2021-05-20 (×2): 40 mg via SUBCUTANEOUS
  Filled 2021-05-19 (×2): qty 0.4

## 2021-05-19 MED ORDER — ACETAMINOPHEN 325 MG PO TABS
650.0000 mg | ORAL_TABLET | Freq: Four times a day (QID) | ORAL | Status: DC | PRN
  Administered 2021-05-19: 650 mg via ORAL
  Filled 2021-05-19: qty 2

## 2021-05-19 MED ORDER — SENNOSIDES-DOCUSATE SODIUM 8.6-50 MG PO TABS: 1.0000 | ORAL_TABLET | Freq: Every evening | ORAL | Status: DC | PRN

## 2021-05-19 MED ORDER — SODIUM CHLORIDE 0.9% FLUSH
3.0000 mL | Freq: Once | INTRAVENOUS | Status: AC
  Administered 2021-05-19: 3 mL via INTRAVENOUS
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
## 2021-05-19 NOTE — Progress Notes (Signed)
EEG complete - results pending 
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
## 2021-05-20 ENCOUNTER — Encounter (HOSPITAL_COMMUNITY): Payer: Self-pay | Admitting: Internal Medicine

## 2021-05-20 DIAGNOSIS — F0781 Postconcussional syndrome: Secondary | ICD-10-CM

## 2021-05-20 LAB — RAPID URINE DRUG SCREEN, HOSP PERFORMED
Amphetamines: NOT DETECTED
Barbiturates: NOT DETECTED
Benzodiazepines: NOT DETECTED
Cocaine: NOT DETECTED
Opiates: NOT DETECTED
Tetrahydrocannabinol: POSITIVE — AB

## 2021-05-20 LAB — COMPREHENSIVE METABOLIC PANEL
ALT: 19 U/L (ref 0–44)
AST: 24 U/L (ref 15–41)
Albumin: 3.3 g/dL — ABNORMAL LOW (ref 3.5–5.0)
Alkaline Phosphatase: 76 U/L (ref 38–126)
Anion gap: 8 (ref 5–15)
BUN: 8 mg/dL (ref 6–20)
CO2: 26 mmol/L (ref 22–32)
Calcium: 9.1 mg/dL (ref 8.9–10.3)
Chloride: 102 mmol/L (ref 98–111)
Creatinine, Ser: 0.93 mg/dL (ref 0.61–1.24)
GFR, Estimated: >60 mL/min (ref >60)
Glucose, Bld: 111 mg/dL — ABNORMAL HIGH (ref 70–99)
Potassium: 3.4 mmol/L — ABNORMAL LOW (ref 3.5–5.1)
Sodium: 136 mmol/L (ref 135–145)
Total Bilirubin: 1 mg/dL (ref 0.3–1.2)
Total Protein: 6.3 g/dL — ABNORMAL LOW (ref 6.5–8.1)

## 2021-05-20 LAB — URINALYSIS, ROUTINE W REFLEX MICROSCOPIC
Bilirubin Urine: NEGATIVE
Glucose, UA: NEGATIVE mg/dL
Hgb urine dipstick: NEGATIVE
Ketones, ur: NEGATIVE mg/dL
Leukocytes,Ua: NEGATIVE
Nitrite: NEGATIVE
Protein, ur: NEGATIVE mg/dL
Specific Gravity, Urine: >1.046 — ABNORMAL HIGH (ref 1.005–1.030)
pH: 6 (ref 5.0–8.0)

## 2021-05-20 LAB — CBC
HCT: 41.2 % (ref 39.0–52.0)
Hemoglobin: 13.7 g/dL (ref 13.0–17.0)
MCH: 29.1 pg (ref 26.0–34.0)
MCHC: 33.3 g/dL (ref 30.0–36.0)
MCV: 87.7 fL (ref 80.0–100.0)
Platelets: 209 10*3/uL (ref 150–400)
RBC: 4.7 MIL/uL (ref 4.22–5.81)
RDW: 12.8 % (ref 11.5–15.5)
WBC: 5.9 10*3/uL (ref 4.0–10.5)
nRBC: 0 % (ref 0.0–0.2)

## 2021-05-20 LAB — SARS CORONAVIRUS 2 (TAT 6-24 HRS): SARS Coronavirus 2: NEGATIVE

## 2021-05-20 MED ORDER — SILVER SULFADIAZINE 1 % EX CREA: TOPICAL_CREAM | Freq: Two times a day (BID) | CUTANEOUS | 0 refills | Status: DC

## 2021-05-20 MED ORDER — SENNOSIDES-DOCUSATE SODIUM 8.6-50 MG PO TABS: 1.0000 | ORAL_TABLET | Freq: Every evening | ORAL | 0 refills | Status: DC | PRN

## 2021-05-20 MED ORDER — CEPHALEXIN 500 MG PO CAPS
500.0000 mg | ORAL_CAPSULE | Freq: Four times a day (QID) | ORAL | Status: DC
  Administered 2021-05-20: 500 mg via ORAL
  Filled 2021-05-20: qty 1

## 2021-05-20 MED ORDER — CEPHALEXIN 500 MG PO CAPS: 500.0000 mg | ORAL_CAPSULE | Freq: Four times a day (QID) | ORAL | 0 refills | Status: AC

## 2021-05-20 NOTE — Plan of Care (Signed)
  Problem: Education: Goal: Knowledge of General Education information will improve Description: Including pain rating scale, medication(s)/side effects and non-pharmacologic comfort measures Outcome: Adequate for Discharge   
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
## 2021-05-20 NOTE — Discharge Instructions (Addendum)
FOLLOW-UP INSTRUCTIONS:  Thank you for allowing [place] to be part of your care. You were hospitalized for AMS (altered mental status).  Please follow up with the following providers: A. Primary care physician in 1-2 weeks for hospital follow up   B. Neurology in 2-4 weeks   Please note these changes made to your medications:   A. Medications to continue: Current Meds  Medication Sig   acetaminophen (TYLENOL) 500 MG tablet Take 1,000 mg by mouth every 6 (six) hours as needed for mild pain or headache.      B. Medications to start:  Current Facility-Administered Medications:    cephALEXin (KEFLEX) capsule 500 mg, 500 mg, Oral, Q6H, Rocklin Soderquist N, DO   senna-docusate (Senokot-S) tablet 1 tablet, 1 tablet, Oral, QHS PRN, Dolan Amen, MD   silver sulfADIAZINE (SILVADENE) 1 % cream, , Topical, BID, Gust Rung, DO, Given at 05/20/21 1025   C. Medications to discontinue:  None    Please make sure to complete your course of antibiotics (Keflex, 4x a day for 4 days). Follow up with neurology as well.   Please call our clinic if you have any questions or concerns, we may be able to help and keep you from a long and expensive emergency room wait. Our clinic and after hours phone number is [phone], the best time to call is Monday through Friday 9 am to 4 pm but there is always someone available 24/7 if you have an emergency. If you need medication refills please notify your pharmacy one week in advance and they will send [place] a request.

## 2021-06-02 ENCOUNTER — Inpatient Hospital Stay: Payer: Self-pay | Admitting: Family Medicine

## 2021-06-30 ENCOUNTER — Ambulatory Visit: Payer: No Typology Code available for payment source | Admitting: Neurology

## 2023-01-11 ENCOUNTER — Ambulatory Visit (HOSPITAL_COMMUNITY)
Admission: EM | Admit: 2023-01-11 | Discharge: 2023-01-11 | Disposition: A | Discharge status: Home / Self Care | Payer: Self-pay | Attending: Emergency Medicine | Admitting: Emergency Medicine

## 2023-01-11 ENCOUNTER — Ambulatory Visit (INDEPENDENT_AMBULATORY_CARE_PROVIDER_SITE_OTHER): Payer: Self-pay

## 2023-01-11 ENCOUNTER — Encounter (HOSPITAL_COMMUNITY): Payer: Self-pay | Admitting: Emergency Medicine

## 2023-01-11 ENCOUNTER — Other Ambulatory Visit: Payer: Self-pay

## 2023-01-11 DIAGNOSIS — T148XXA Other injury of unspecified body region, initial encounter: Secondary | ICD-10-CM

## 2023-01-11 DIAGNOSIS — S50311A Abrasion of right elbow, initial encounter: Secondary | ICD-10-CM

## 2023-01-11 DIAGNOSIS — S00511A Abrasion of lip, initial encounter: Secondary | ICD-10-CM

## 2023-01-11 DIAGNOSIS — S90414A Abrasion, right lesser toe(s), initial encounter: Secondary | ICD-10-CM

## 2023-01-11 DIAGNOSIS — S5001XA Contusion of right elbow, initial encounter: Secondary | ICD-10-CM

## 2023-01-11 DIAGNOSIS — S20312A Abrasion of left front wall of thorax, initial encounter: Secondary | ICD-10-CM

## 2023-01-11 DIAGNOSIS — S01512A Laceration without foreign body of oral cavity, initial encounter: Secondary | ICD-10-CM

## 2023-01-11 DIAGNOSIS — M25521 Pain in right elbow: Secondary | ICD-10-CM

## 2023-01-11 DIAGNOSIS — M546 Pain in thoracic spine: Secondary | ICD-10-CM

## 2023-01-11 MED ORDER — TETANUS-DIPHTH-ACELL PERTUSSIS 5-2.5-18.5 LF-MCG/0.5 IM SUSY
PREFILLED_SYRINGE | INTRAMUSCULAR | Status: AC
  Filled 2023-01-11: qty 0.5

## 2023-01-11 MED ORDER — KETOROLAC TROMETHAMINE 30 MG/ML IJ SOLN
INTRAMUSCULAR | Status: AC
  Filled 2023-01-11: qty 1

## 2023-01-11 MED ORDER — KETOROLAC TROMETHAMINE 30 MG/ML IJ SOLN
30.0000 mg | Freq: Once | INTRAMUSCULAR | Status: AC
  Administered 2023-01-11: 30 mg via INTRAMUSCULAR

## 2023-01-11 MED ORDER — TETANUS-DIPHTH-ACELL PERTUSSIS 5-2.5-18.5 LF-MCG/0.5 IM SUSY
0.5000 mL | PREFILLED_SYRINGE | Freq: Once | INTRAMUSCULAR | Status: DC
Start: 2023-01-11 — End: 2023-01-11
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
## 2023-01-11 NOTE — Discharge Instructions (Signed)
Keep wounds clean and dry until they heal.   For your mouth wound, eat soft foods for two to three days, rinse the mouth with water after eating, and avoid spicy or salty foods until the wound is healed. Avoid the use of straws (the pressure may cause bruising or bleeding at the wound site).  Use tylenol or ibuprofen as directed on the package for pain.
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
## 2023-01-27 ENCOUNTER — Encounter (HOSPITAL_COMMUNITY): Payer: Self-pay

## 2023-01-27 ENCOUNTER — Other Ambulatory Visit: Payer: Self-pay

## 2023-01-27 ENCOUNTER — Emergency Department (HOSPITAL_COMMUNITY)
Admission: EM | Admit: 2023-01-27 | Discharge: 2023-01-27 | Disposition: A | Discharge status: Home / Self Care | Payer: 59 | Attending: Emergency Medicine | Admitting: Emergency Medicine

## 2023-01-27 DIAGNOSIS — R21 Rash and other nonspecific skin eruption: Secondary | ICD-10-CM | POA: Diagnosis present

## 2023-01-27 DIAGNOSIS — L239 Allergic contact dermatitis, unspecified cause: Secondary | ICD-10-CM

## 2023-01-27 MED ORDER — PREDNISONE 10 MG PO TABS: 20.0000 mg | ORAL_TABLET | Freq: Every day | ORAL | 0 refills | Status: DC

## 2023-01-27 NOTE — ED Triage Notes (Signed)
Patient is here for evaluation of rash to bilateral arms. Reports that he does not know where the rash came from. Reports itching to the arms last night, but not this morning.

## 2023-01-27 NOTE — ED Provider Notes (Signed)
[place] [facility] AT [facility] Provider Note   CSN: [id number] Arrival date & time: 01/27/23  5784     History  Chief Complaint  Patient presents with   Rash    Erik Benton is a 32 y.o. male.  Patient complains of a rash to both of his arms.  He was working in the yard a couple days ago  The history is provided by the patient and medical records. No language interpreter was used.  Rash Location: Both arms. Quality: blistering   Severity:  Mild Onset quality:  Sudden Timing:  Constant Progression:  Worsening Chronicity:  New Context: chemical exposure   Relieved by:  Nothing Associated symptoms: no abdominal pain, no diarrhea, no fatigue and no headaches        Home Medications Prior to Admission medications   Medication Sig Start Date End Date Taking? Authorizing Provider  predniSONE (DELTASONE) 10 MG tablet Take 2 tablets (20 mg total) by mouth daily. 01/27/23  Yes Bethann Berkshire, MD  acetaminophen (TYLENOL) 500 MG tablet Take 1,000 mg by mouth every 6 (six) hours as needed for mild pain or headache.    [provider]  senna-docusate (SENOKOT-S) 8.6-50 MG tablet Take 1 tablet by mouth at bedtime as needed for mild constipation. 05/20/21   Atway, Rayann N, DO  silver sulfADIAZINE (SILVADENE) 1 % cream Apply topically 2 (two) times daily. Patient not taking: Reported on 01/11/2023 05/20/21   Chauncey Mann, DO      Allergies    Patient has no known allergies.    Review of Systems   Review of Systems  Constitutional:  Negative for appetite change and fatigue.  HENT:  Negative for congestion, ear discharge and sinus pressure.   Eyes:  Negative for discharge.  Respiratory:  Negative for cough.   Cardiovascular:  Negative for chest pain.  Gastrointestinal:  Negative for abdominal pain and diarrhea.  Genitourinary:  Negative for frequency and hematuria.  Musculoskeletal:  Negative for back pain.  Skin:  Positive for rash.   Neurological:  Negative for seizures and headaches.  Psychiatric/Behavioral:  Negative for hallucinations.     Physical Exam Updated Vital Signs BP (!) 130/93   Pulse 67   Temp 98 F (36.7 C) (Oral)   Resp 18   Ht 6\' 2"  (1.88 m)   Wt 79.4 kg   SpO2 100%   BMI 22.47 kg/m  Physical Exam Vitals and nursing note reviewed.  Constitutional:      Appearance: He is well-developed.  HENT:     Head: Normocephalic.     Nose: Nose normal.  Eyes:     General: No scleral icterus.    Conjunctiva/sclera: Conjunctivae normal.  Neck:     Thyroid: No thyromegaly.  Cardiovascular:     Rate and Rhythm: Normal rate and regular rhythm.     Heart sounds: No murmur heard.    No friction rub. No gallop.  Pulmonary:     Breath sounds: No stridor. No wheezing or rales.  Chest:     Chest wall: No tenderness.  Abdominal:     General: There is no distension.     Tenderness: There is no abdominal tenderness. There is no rebound.  Musculoskeletal:        General: Normal range of motion.     Cervical back: Neck supple.     Comments: Maculopapular rash both arms  Lymphadenopathy:     Cervical: No cervical adenopathy.  Skin:  Findings: No erythema or rash.  Neurological:     Mental Status: He is alert and oriented to person, place, and time.     Motor: No abnormal muscle tone.     Coordination: Coordination normal.  Psychiatric:        Behavior: Behavior normal.     ED Results / Procedures / Treatments   Labs (all labs ordered are listed, but only abnormal results are displayed) Labs Reviewed - No data to display  EKG None  Radiology No results found.  Procedures Procedures    Medications Ordered in ED Medications - No data to display  ED Course/ Medical Decision Making/ A&P                             Medical Decision Making Risk Prescription drug management.   Patient with contact dermatitis.  He is given some prednisone and will take Benadryl follow-up with  me        Final Clinical Impression(s) / ED Diagnoses Final diagnoses:  Allergic dermatitis    Rx / DC Orders ED Discharge Orders          Ordered    predniSONE (DELTASONE) 10 MG tablet  Daily        01/27/23 0858              Bethann Berkshire, MD 02/01/23 1002

## 2023-01-27 NOTE — Discharge Instructions (Signed)
Take Benadryl for itching.  Keep area clean.  Follow-up next week if not improving

## 2023-08-08 ENCOUNTER — Ambulatory Visit (HOSPITAL_COMMUNITY)
Admission: EM | Admit: 2023-08-08 | Discharge: 2023-08-08 | Disposition: A | Discharge status: Home / Self Care | Payer: 59 | Attending: Emergency Medicine | Admitting: Emergency Medicine

## 2023-08-08 ENCOUNTER — Encounter (HOSPITAL_COMMUNITY): Payer: Self-pay | Admitting: *Deleted

## 2023-08-08 DIAGNOSIS — T63301A Toxic effect of unspecified spider venom, accidental (unintentional), initial encounter: Secondary | ICD-10-CM

## 2023-08-08 MED ORDER — DOXYCYCLINE HYCLATE 100 MG PO CAPS: 100.0000 mg | ORAL_CAPSULE | Freq: Two times a day (BID) | ORAL | 0 refills | Status: DC

## 2023-08-08 NOTE — ED Provider Notes (Signed)
[facility]    CSN: [id number] Arrival date & time: 08/08/23  1741      History   Chief Complaint Chief Complaint  Patient presents with   Insect Bite    HPI Erik Benton is a 32 y.o. male.   Patient presents for spider bite on his back that he woke up with this morning that has progressively become worse.  Denies fever.     Past Medical History:  Diagnosis Date   ADHD (attention deficit hyperactivity disorder)    Concussion     Patient Active Problem List   Diagnosis Date Noted   Post concussion syndrome 05/20/2021   AMS (altered mental status) 05/19/2021   ADHD (attention deficit hyperactivity disorder)     Past Surgical History:  Procedure Laterality Date   WRIST FRACTURE SURGERY     WRIST SURGERY         Home Medications    Prior to Admission medications   Medication Sig Start Date End Date Taking? Authorizing Provider  doxycycline (VIBRAMYCIN) 100 MG capsule Take 1 capsule (100 mg total) by mouth 2 (two) times daily. 08/08/23  Yes Letta Kocher, NP    Family History History reviewed. No pertinent family history.  Social History Social History   Tobacco Use   Smoking status: Some Days    Current packs/day: 2.00    Types: Cigarettes   Smokeless tobacco: Never  Vaping Use   Vaping status: Every Day  Substance Use Topics   Alcohol use: Yes   Drug use: Yes    Types: Marijuana     Allergies   Patient has no known allergies.   Review of Systems Review of Systems  Constitutional:  Negative for fever.  Skin:  Positive for color change and wound.     Physical Exam Triage Vital Signs ED Triage Vitals  Encounter Vitals Group     BP 08/08/23 1801 115/76     Systolic BP Percentile --      Diastolic BP Percentile --      Pulse Rate 08/08/23 1801 72     Resp 08/08/23 1801 18     Temp 08/08/23 1801 98 F (36.7 C)     Temp Source 08/08/23 1801 Oral     SpO2 08/08/23 1801 97 %     Weight --      Height --       Head Circumference --      Peak Flow --      Pain Score 08/08/23 1800 8     Pain Loc --      Pain Education --      Exclude from Growth Chart --    No data found.  Updated Vital Signs BP 115/76 (BP Location: Left Arm)   Pulse 72   Temp 98 F (36.7 C) (Oral)   Resp 18   SpO2 97%   Visual Acuity Right Eye Distance:   Left Eye Distance:   Bilateral Distance:    Right Eye Near:   Left Eye Near:    Bilateral Near:     Physical Exam Vitals and nursing note reviewed.  Constitutional:      General: He is awake. He is not in acute distress.    Appearance: Normal appearance. He is well-developed and well-groomed. He is not ill-appearing, toxic-appearing or diaphoretic.  Skin:    General: Skin is warm and dry.     Findings: Erythema and lesion present.     Comments: Nickel-sized blister-like  wound to left upper back with surrounding erythema.  Neurological:     Mental Status: He is alert.  Psychiatric:        Behavior: Behavior is cooperative.      UC Treatments / Results  Labs (all labs ordered are listed, but only abnormal results are displayed) Labs Reviewed - No data to display  EKG   Radiology No results found.  Procedures Procedures (including critical care time)  Medications Ordered in UC Medications - No data to display  Initial Impression / Assessment and Plan / UC Course  I have reviewed the triage vital signs and the nursing notes.  Pertinent labs & imaging results that were available during my care of the patient were reviewed by me and considered in my medical decision making (see chart for details).     Patient presented for spider bite on his back that he woke up with this morning and has become progressively worse.  Denies fever.  Upon assessment patient has nickel-sized blisterlike wound to left upper back with surrounding erythema.  Prescribed doxycycline for wound infection.  Discussed follow-up, return, and strict ER precautions. Final  Clinical Impressions(s) / UC Diagnoses   Final diagnoses:  Spider bite wound, accidental or unintentional, initial encounter     Discharge Instructions      Start taking doxycycline twice daily for 10 days.  You can take ibuprofen and Tylenol every 4-6 hours as needed for pain.  I recommend putting putting warm compresses on the area to help with drainage and swelling.  If the area persists or becomes worse come back for reevaluation.  If it spreads significantly or you develop high fevers please seek immediate medical treatment in the ER.    ED Prescriptions     Medication Sig Dispense Auth. Provider   doxycycline (VIBRAMYCIN) 100 MG capsule Take 1 capsule (100 mg total) by mouth 2 (two) times daily. 20 capsule Wynonia Lawman A, NP      PDMP not reviewed this encounter.   Wynonia Lawman A, NP 08/08/23 [phone]

## 2023-08-08 NOTE — Discharge Instructions (Signed)
Start taking doxycycline twice daily for 10 days.  You can take ibuprofen and Tylenol every 4-6 hours as needed for pain.  I recommend putting putting warm compresses on the area to help with drainage and swelling.  If the area persists or becomes worse come back for reevaluation.  If it spreads significantly or you develop high fevers please seek immediate medical treatment in the ER.

## 2023-08-08 NOTE — ED Triage Notes (Signed)
Pt sates he was bit by a spider, he is unsure when he noticed it this morning. The area looks blistered and is red around the edges., location is left upper back. He did put alcohol on it at work. He states he has numbness on his left side

## 2023-09-11 ENCOUNTER — Encounter (HOSPITAL_COMMUNITY): Payer: Self-pay | Admitting: Emergency Medicine

## 2023-09-11 ENCOUNTER — Emergency Department (HOSPITAL_COMMUNITY)
Admission: EM | Admit: 2023-09-11 | Discharge: 2023-09-11 | Disposition: A | Discharge status: Home / Self Care | Payer: 59 | Attending: Emergency Medicine | Admitting: Emergency Medicine

## 2023-09-11 ENCOUNTER — Emergency Department (HOSPITAL_COMMUNITY): Payer: 59

## 2023-09-11 DIAGNOSIS — R059 Cough, unspecified: Secondary | ICD-10-CM | POA: Diagnosis present

## 2023-09-11 DIAGNOSIS — J069 Acute upper respiratory infection, unspecified: Secondary | ICD-10-CM | POA: Diagnosis not present

## 2023-09-11 DIAGNOSIS — Z20822 Contact with and (suspected) exposure to covid-19: Secondary | ICD-10-CM | POA: Diagnosis not present

## 2023-09-11 LAB — RESP PANEL BY RT-PCR (RSV, FLU A&B, COVID)  RVPGX2
Influenza A by PCR: NEGATIVE
Influenza B by PCR: NEGATIVE
Resp Syncytial Virus by PCR: NEGATIVE
SARS Coronavirus 2 by RT PCR: NEGATIVE

## 2023-09-11 NOTE — ED Provider Notes (Signed)
[place] [facility]MENT AT [facility] Provider Note   CSN: [id number] Arrival date & time: 09/11/23  1113     History  Chief Complaint  Patient presents with   Cough   Sore Throat    Erik Benton is a 32 y.o. male Patient presents to the ED today with chief complaint of cough, stuffy nose, and sore throat for 1 week, starting last Tuesday. He had one episode of vomiting last week. He went to work this morning and his employer sent him away after he was persistently coughing at work. He reports a productive cough with green/yellow sputum. His employer is requesting he receives a work note prior to returning to work. His employer reports several coworkers are out sick this week. He has tried supportive measures such as tea and honey at home with limited relief. Patient is unsure if he has had a fever, he has not taken temperatures at home. Positive for productive cough, rhinorrhea, and mild chest pain with coughing. Patient denies and hematemesis, SOB, chills, ear pain.    Cough Sore Throat       Home Medications Prior to Admission medications   Medication Sig Start Date End Date Taking? Authorizing Provider  doxycycline (VIBRAMYCIN) 100 MG capsule Take 1 capsule (100 mg total) by mouth 2 (two) times daily. 08/08/23   Letta Kocher, NP      Allergies    Patient has no known allergies.    Review of Systems   Review of Systems  Respiratory:  Positive for cough.   All other systems reviewed and are negative.   Physical Exam Updated Vital Signs BP 128/78   Pulse 75   Temp 98.2 F (36.8 C) (Oral)   Resp 18   Ht 6\' 2"  (1.88 m)   Wt 87.1 kg   SpO2 100%   BMI 24.65 kg/m  Physical Exam Vitals and nursing note reviewed.  Constitutional:      General: He is not in acute distress.    Appearance: Normal appearance.  HENT:     Head: Normocephalic and atraumatic.     Mouth/Throat:     Comments: No significant posterior oropharynx erythema,  swelling, exudate. Uvula midline, tonsils 1+ bilaterally.  No trismus, stridor, evidence of PTA, floor of mouth swelling or redness.  Eyes:     General:        Right eye: No discharge.        Left eye: No discharge.  Cardiovascular:     Rate and Rhythm: Normal rate and regular rhythm.  Pulmonary:     Effort: Pulmonary effort is normal. No respiratory distress.     Comments: No wheezing, rhonchi, stridor, rales, patient has no cough during my exam Musculoskeletal:        General: No deformity.  Skin:    General: Skin is warm and dry.  Neurological:     Mental Status: He is alert and oriented to person, place, and time.  Psychiatric:        Mood and Affect: Mood normal.        Behavior: Behavior normal.     ED Results / Procedures / Treatments   Labs (all labs ordered are listed, but only abnormal results are displayed) Labs Reviewed  RESP PANEL BY RT-PCR (RSV, FLU A&B, COVID)  [id number]    EKG None  Radiology No results found.  Procedures Procedures    Medications Ordered in ED Medications - No data to display  ED Course/  Medical Decision Making/ A&P                                 Medical Decision Making Amount and/or Complexity of Data Reviewed Radiology: ordered.   This is a well-appearing 31 to male who presents with concern for 7 days of cough, sore throat.  My emergent differential diagnosis includes acute upper respiratory infection with COVID, flu, RSV versus new asthma presentation, acute bronchitis, less clinical concern for pneumonia.  Also considered other ENT emergencies, Ludwig angina, strep pharyngitis, mono, versus epiglottis, tonsillitis versus other.  This is not an exhaustive differential.  On my exam patient is overall well-appearing, they have temperature of 98.2, breathing unlabored, no tachypnea, no respiratory distress, stable oxygen saturation.  Patient without tachycardia. RVP independently reviewed by myself shows negative for COVID, flu, RSV.   I independently interpreted plain film chest x-ray which shows no evidence of acute intrathoracic abnormality..  Patient symptoms are consistent with viral upper respiratory infection, versus possible lingering postviral cough syndrome only.  Encouraged ibuprofen, Tylenol, rest, plenty of fluids.  Discussed extensive return precautions.  Patient discharged in stable condition at this time.  Final Clinical Impression(s) / ED Diagnoses Final diagnoses:  Viral URI with cough    Rx / DC Orders ED Discharge Orders     None         Olene Floss, PA-C 09/11/23 1354    Alvira Monday, MD 09/12/23 2311

## 2023-09-11 NOTE — Discharge Instructions (Signed)
Please use Tylenol or ibuprofen for pain.  You may use 600 mg ibuprofen every 6 hours or 1000 mg of Tylenol every 6 hours.  You may choose to alternate between the 2.  This would be most effective.  Not to exceed 4 g of Tylenol within 24 hours.  Not to exceed 3200 mg ibuprofen 24 hours.  You can use over-the-counter cough and cold medication such as Robitussin, NyQuil, DayQuil.  If you have persistent cough you can use tea with honey, you are okay to return to work

## 2023-09-11 NOTE — ED Triage Notes (Signed)
Patient arrives ambulatory by POV c/o feeling sick with stuffy nose, cough, mucus, and sore throat x 1 week. Denies any sick contacts.

## 2023-09-28 ENCOUNTER — Ambulatory Visit (HOSPITAL_COMMUNITY)
Admission: EM | Admit: 2023-09-28 | Discharge: 2023-09-28 | Disposition: A | Discharge status: Home / Self Care | Payer: Medicaid Other | Attending: Family Medicine | Admitting: Family Medicine

## 2023-09-28 ENCOUNTER — Encounter (HOSPITAL_COMMUNITY): Payer: Self-pay

## 2023-09-28 DIAGNOSIS — R42 Dizziness and giddiness: Secondary | ICD-10-CM | POA: Diagnosis not present

## 2023-09-28 DIAGNOSIS — K1379 Other lesions of oral mucosa: Secondary | ICD-10-CM

## 2023-09-28 LAB — POCT FASTING CBG KUC MANUAL ENTRY: POCT Glucose (KUC): 125 mg/dL — AB (ref 70–99)

## 2023-09-28 MED ORDER — TRIAMCINOLONE ACETONIDE 0.1 % MT PSTE: 1.0000 | PASTE | Freq: Two times a day (BID) | OROMUCOSAL | 1 refills | Status: AC

## 2023-09-28 NOTE — ED Provider Notes (Signed)
[facility]   [id number] 09/28/23 Arrival Time: 1620  ASSESSMENT & PLAN:  1. Lightheadedness   2. Mucocele of mouth    Normal neurologic exam. No indication for neurodiagnostic imaging at this time. Is feeling better now. Work note provided. ECG: NSR.   Discharge Instructions      You have been seen at the [facility] today for lightheadedness. Your evaluation today was not suggestive of any emergent condition requiring medical intervention at this time. Your ECG (heart tracing) did not show any worrisome changes. However, some medical problems make take more time to appear. Therefore, it's very important that you pay attention to any new symptoms or worsening of your current condition.  Please proceed directly to the Emergency Department immediately should you feel worse in any way. Ensure proper hydration and rest. Do not drink alcohol for the next few days.     For mucocele, trial of: Meds ordered this encounter  Medications   triamcinolone (KENALOG) 0.1 % paste    Sig: Use as directed 1 Application in the mouth or throat 2 (two) times daily.    Dispense:  5 g    Refill:  1   If this doesn't help, will see dentist.  Reviewed expectations re: course of current medical issues. Questions answered. Outlined signs and symptoms indicating need for more acute intervention. Patient verbalized understanding. After Visit Summary given.   SUBJECTIVE:  Erik Benton is a 32 y.o. male who reports episode of lightheadedness at work; approx 7 am. Left work around 11 am. Fatigued. Feeling better now but still fatigued. Denies CP/SOB. Social History   Substance and Sexual Activity  Alcohol Use Yes  Last drink yesterday; 'few beers'. None today. Denies recreational drug use. Also 'bump in mouth'. Lower R lip/gum. Present for months. Occas drains clear fluid. Not painful.  OBJECTIVE:  Vitals:   09/28/23 1751  BP: 117/70  Pulse: 69  Resp: 18  Temp:  97.8 F (36.6 C)  TempSrc: Oral  SpO2: 94%  Weight: 92.1 kg  Height: 6\' 2"  (1.88 m)    General appearance: alert; no distress Eyes: PERRLA; EOMI; conjunctivae normal HENT: normocephalic; atraumatic; small mucocele of R lower gum; no signs of infection CV: RRR Resp: CTAB Neck: supple with FROM Extremities: no cyanosis or edema; symmetrical with no gross deformities Skin: warm and dry Neurologic: normal gait; DTR's normal and symmetric; CN 2-12 grossly intact Psychological: alert and cooperative; normal mood and affect  Investigations: Results for orders placed or performed during the hospital encounter of 09/28/23  POC CBG monitoring  Result Value Ref Range   POCT Glucose ([facility]) 125 (A) 70 - 99 mg/dL   Labs Reviewed  POCT FASTING CBG [facility] MANUAL ENTRY - Abnormal; Notable for the following components:      Result Value   POCT Glucose ([facility]) 125 (*)    All other components within normal limits    No Known Allergies  Past Medical History:  Diagnosis Date   ADHD (attention deficit hyperactivity disorder)    Concussion    Social History   Socioeconomic History   Marital status: Significant Other    Spouse name: Not on file   Number of children: Not on file   Years of education: Not on file   Highest education level: Not on file  Occupational History   Not on file  Tobacco Use   Smoking status: Some Days    Current packs/day: 2.00    Types: Cigarettes  Smokeless tobacco: Never  Vaping Use   Vaping status: Every Day  Substance and Sexual Activity   Alcohol use: Yes   Drug use: Yes    Types: Marijuana   Sexual activity: Not Currently  Other Topics Concern   Not on file  Social History Narrative   ** Merged History Encounter **       Social Determinants of Health   Financial Resource Strain: Not on file  Food Insecurity: Not on file  Transportation Needs: Not on file  Physical Activity: Not on file  Stress: Not on file  Social Connections: Not on file   Intimate Partner Violence: Not on file   History reviewed. No pertinent family history. Past Surgical History:  Procedure Laterality Date   WRIST FRACTURE SURGERY     WRIST SURGERY         Mardella Layman, MD 09/28/23 1859

## 2023-09-28 NOTE — Discharge Instructions (Addendum)
You have been seen at the [facility] today for lightheadedness. Your evaluation today was not suggestive of any emergent condition requiring medical intervention at this time. Your ECG (heart tracing) did not show any worrisome changes. However, some medical problems make take more time to appear. Therefore, it's very important that you pay attention to any new symptoms or worsening of your current condition.  Please proceed directly to the Emergency Department immediately should you feel worse in any way. Ensure proper hydration and rest. Do not drink alcohol for the next few days.

## 2023-09-28 NOTE — ED Triage Notes (Addendum)
Dizziness, lightheaded onset today. Patient states was sent home from work today. States the lights were hurting his eyes, he works with machines and started to get dizzy and could not see what he was doing fully. No history of dizzy spells.   Slight dizziness with movement.   Also there is a bump on the lower right inside of the lip.

## 2023-09-28 NOTE — ED Notes (Signed)
Patient states last ate around 1200 today.

## 2024-03-03 ENCOUNTER — Encounter (HOSPITAL_COMMUNITY): Payer: Self-pay

## 2024-03-03 ENCOUNTER — Emergency Department (HOSPITAL_COMMUNITY)
Admission: EM | Admit: 2024-03-03 | Discharge: 2024-03-03 | Disposition: A | Discharge status: Home / Self Care | Attending: Emergency Medicine | Admitting: Emergency Medicine

## 2024-03-03 ENCOUNTER — Emergency Department (HOSPITAL_COMMUNITY)

## 2024-03-03 ENCOUNTER — Other Ambulatory Visit: Payer: Self-pay

## 2024-03-03 DIAGNOSIS — Y907 Blood alcohol level of 200-239 mg/100 ml: Secondary | ICD-10-CM | POA: Diagnosis not present

## 2024-03-03 DIAGNOSIS — S82891A Other fracture of right lower leg, initial encounter for closed fracture: Secondary | ICD-10-CM | POA: Diagnosis not present

## 2024-03-03 DIAGNOSIS — F10920 Alcohol use, unspecified with intoxication, uncomplicated: Secondary | ICD-10-CM | POA: Insufficient documentation

## 2024-03-03 DIAGNOSIS — F10929 Alcohol use, unspecified with intoxication, unspecified: Secondary | ICD-10-CM

## 2024-03-03 DIAGNOSIS — M25571 Pain in right ankle and joints of right foot: Secondary | ICD-10-CM | POA: Diagnosis present

## 2024-03-03 DIAGNOSIS — S40811A Abrasion of right upper arm, initial encounter: Secondary | ICD-10-CM | POA: Insufficient documentation

## 2024-03-03 DIAGNOSIS — T07XXXA Unspecified multiple injuries, initial encounter: Secondary | ICD-10-CM

## 2024-03-03 LAB — CBC
HCT: 49.2 % (ref 39.0–52.0)
Hemoglobin: 16.2 g/dL (ref 13.0–17.0)
MCH: 28.2 pg (ref 26.0–34.0)
MCHC: 32.9 g/dL (ref 30.0–36.0)
MCV: 85.6 fL (ref 80.0–100.0)
Platelets: 223 10*3/uL (ref 150–400)
RBC: 5.75 MIL/uL (ref 4.22–5.81)
RDW: 14.2 % (ref 11.5–15.5)
WBC: 5.1 10*3/uL (ref 4.0–10.5)
nRBC: 0 % (ref 0.0–0.2)

## 2024-03-03 LAB — BASIC METABOLIC PANEL WITH GFR
Anion gap: 10 (ref 5–15)
BUN: 10 mg/dL (ref 6–20)
CO2: 20 mmol/L — ABNORMAL LOW (ref 22–32)
Calcium: 8.4 mg/dL — ABNORMAL LOW (ref 8.9–10.3)
Chloride: 110 mmol/L (ref 98–111)
Creatinine, Ser: 1.16 mg/dL (ref 0.61–1.24)
GFR, Estimated: >60 mL/min (ref >60)
Glucose, Bld: 118 mg/dL — ABNORMAL HIGH (ref 70–99)
Potassium: 4.1 mmol/L (ref 3.5–5.1)
Sodium: 140 mmol/L (ref 135–145)

## 2024-03-03 LAB — ETHANOL: Alcohol, Ethyl (B): 206 mg/dL — ABNORMAL HIGH (ref <15)

## 2024-03-03 MED ORDER — SODIUM CHLORIDE 0.9 % IV SOLN: INTRAVENOUS | Status: AC | PRN

## 2024-03-03 MED ORDER — ONDANSETRON HCL 4 MG/2ML IJ SOLN
4.0000 mg | Freq: Once | INTRAMUSCULAR | Status: AC
  Administered 2024-03-03: 4 mg via INTRAVENOUS
  Filled 2024-03-03: qty 2

## 2024-03-03 MED ORDER — HYDROMORPHONE HCL 1 MG/ML IJ SOLN
1.0000 mg | Freq: Once | INTRAMUSCULAR | Status: AC
  Administered 2024-03-03: 1 mg via INTRAVENOUS
  Filled 2024-03-03: qty 1

## 2024-03-03 MED ORDER — SENNOSIDES-DOCUSATE SODIUM 8.6-50 MG PO TABS: 1.0000 | ORAL_TABLET | Freq: Every day | ORAL | 0 refills | Status: DC

## 2024-03-03 MED ORDER — OXYCODONE-ACETAMINOPHEN 5-325 MG PO TABS: 1.0000 | ORAL_TABLET | Freq: Four times a day (QID) | ORAL | 0 refills | Status: DC | PRN

## 2024-03-03 MED ORDER — SODIUM CHLORIDE 0.9 % IV BOLUS
1000.0000 mL | Freq: Once | INTRAVENOUS | Status: AC
  Administered 2024-03-03: 1000 mL via INTRAVENOUS

## 2024-03-03 MED ORDER — HALOPERIDOL LACTATE 5 MG/ML IJ SOLN
5.0000 mg | Freq: Once | INTRAMUSCULAR | Status: AC
  Administered 2024-03-03: 5 mg via INTRAVENOUS
  Filled 2024-03-03: qty 1

## 2024-03-03 MED ORDER — ETOMIDATE 2 MG/ML IV SOLN
10.0000 mg | Freq: Once | INTRAVENOUS | Status: AC
  Administered 2024-03-03: 5 mg via INTRAVENOUS
  Filled 2024-03-03: qty 10

## 2024-03-03 NOTE — Progress Notes (Signed)
 Orthopedic Tech Progress Note Patient Details:  MABEL ARNS Sep 14, 1991 [id number]  Ortho Devices Type of Ortho Device: Post (short leg) splint, Stirrup splint Ortho Device/Splint Location: rle Ortho Device/Splint Interventions: Ordered, Application, Adjustment  I applied splint with drs assist post reduction. Post Interventions Patient Tolerated: Well Instructions Provided: Care of device, Adjustment of device  Terryann Fiddler 03/03/2024, 4:04 AM

## 2024-03-03 NOTE — Discharge Instructions (Signed)
 You were seen in the emerged part today with a fracture and dislocation of your right ankle.  We were able to put this back in place and you are in a temporary splint.  This needs to stay clean and dry.  You cannot put any weight on your right foot.  You need to call the orthopedic surgeon listed to schedule a follow-up appointment in the next week.  This injury will likely require surgery.  Keep the leg elevated and apply ice to reduce swelling.

## 2024-03-03 NOTE — Sedation Documentation (Signed)
 Wasting the remaining 15mg  of etomidate. With RN D turner.
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
## 2024-03-03 NOTE — Sedation Documentation (Signed)
RT and Ortho at bedside °

## 2024-03-03 NOTE — Sedation Documentation (Signed)
 Paper consent at bedside. Obtained by EDP [facility] - Emergent
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
## 2024-03-10 ENCOUNTER — Other Ambulatory Visit: Payer: Self-pay

## 2024-03-10 ENCOUNTER — Encounter (HOSPITAL_COMMUNITY): Payer: Self-pay | Admitting: Emergency Medicine

## 2024-03-10 ENCOUNTER — Ambulatory Visit (HOSPITAL_COMMUNITY): Admission: EM | Admit: 2024-03-10 | Discharge: 2024-03-10 | Disposition: A | Discharge status: Home / Self Care

## 2024-03-10 DIAGNOSIS — M79604 Pain in right leg: Secondary | ICD-10-CM | POA: Diagnosis not present

## 2024-03-10 NOTE — Discharge Instructions (Signed)
 We have provided you with crutches today as requested. As discussed, paper prescriptions that were sent on 5/11 are at the pharmacy and ready to be picked up. Call [facility] first thing tomorrow morning to schedule a follow-up appointment regarding your leg injury. Return here as needed.

## 2024-03-10 NOTE — ED Triage Notes (Signed)
 Pt states he was seen on the ED a week ago and he has a broken right leg, states the provider never gave him crutches or send pain medication to pharmacy and he is on a lot of pain since then.

## 2024-03-10 NOTE — ED Provider Notes (Signed)
 [facility]    CSN: [id number] Arrival date & time: 03/10/24  1509      History   Chief Complaint Chief Complaint  Patient presents with   Leg Injury    HPI Erik Benton is a 33 y.o. male.   Patient presents requesting crutches and states that he was never sent any pain medicine after injuring his leg last week.  Patient states that he was seen in the ER on 5/11 and fractured his ankle.  Patient states that he was not provided with any crutches or given any medication at that time.  Patient states according to his chart he was sent 3 prescriptions, but states that they were not sent there.    The history is provided by the patient and medical records.    Past Medical History:  Diagnosis Date   ADHD (attention deficit hyperactivity disorder)    Concussion     Patient Active Problem List   Diagnosis Date Noted   Post concussion syndrome 05/20/2021   AMS (altered mental status) 05/19/2021   ADHD (attention deficit hyperactivity disorder)     Past Surgical History:  Procedure Laterality Date   WRIST FRACTURE SURGERY     WRIST SURGERY         Home Medications    Prior to Admission medications   Medication Sig Start Date End Date Taking? Authorizing Provider  oxyCODONE -acetaminophen  (PERCOCET/ROXICET) 5-325 MG tablet Take 1 tablet by mouth every 6 (six) hours as needed for severe pain (pain score 7-10). 03/03/24   Long, Joshua G, MD  senna-docusate (SENOKOT-S) 8.6-50 MG tablet Take 1 tablet by mouth daily. 03/03/24   Long, Shereen Dike, MD  triamcinolone  (KENALOG ) 0.1 % paste Use as directed 1 Application in the mouth or throat 2 (two) times daily. 09/28/23   Afton Albright, MD    Family History History reviewed. No pertinent family history.  Social History Social History   Tobacco Use   Smoking status: Some Days    Current packs/day: 2.00    Types: Cigarettes   Smokeless tobacco: Never  Vaping Use   Vaping status: Every Day  Substance Use  Topics   Alcohol use: Yes   Drug use: Yes    Types: Marijuana     Allergies   Patient has no known allergies.   Review of Systems Review of Systems  Per HPI  Physical Exam Triage Vital Signs ED Triage Vitals  Encounter Vitals Group     BP 03/10/24 1537 117/74     Systolic BP Percentile --      Diastolic BP Percentile --      Pulse Rate 03/10/24 1536 71     Resp 03/10/24 1536 18     Temp 03/10/24 1536 98 F (36.7 C)     Temp Source 03/10/24 1536 Oral     SpO2 03/10/24 1536 100 %     Weight --      Height --      Head Circumference --      Peak Flow --      Pain Score 03/10/24 1536 9     Pain Loc --      Pain Education --      Exclude from Growth Chart --    No data found.  Updated Vital Signs BP 117/74 (BP Location: Right Arm)   Pulse 71   Temp 98 F (36.7 C) (Oral)   Resp 18   SpO2 100%   Visual Acuity Right Eye Distance:  Left Eye Distance:   Bilateral Distance:    Right Eye Near:   Left Eye Near:    Bilateral Near:     Physical Exam Vitals and nursing note reviewed.  Constitutional:      General: He is awake. He is not in acute distress.    Appearance: Normal appearance. He is well-developed and well-groomed. He is not ill-appearing.  Musculoskeletal:     Comments: Right lower leg is currently in a splint.  Skin:    General: Skin is warm and dry.  Neurological:     Mental Status: He is alert.  Psychiatric:        Behavior: Behavior is cooperative.      UC Treatments / Results  Labs (all labs ordered are listed, but only abnormal results are displayed) Labs Reviewed - No data to display  EKG   Radiology No results found.  Procedures Procedures (including critical care time)  Medications Ordered in UC Medications - No data to display  Initial Impression / Assessment and Plan / UC Course  I have reviewed the triage vital signs and the nursing notes.  Pertinent labs & imaging results that were available during my care of the  patient were reviewed by me and considered in my medical decision making (see chart for details).     Patient is well-appearing.  Vitals are stable.  Upon assessment the right lower leg is currently in a splint.  Provided patient with crutches.  Called pharmacy to confirm if prescriptions were there and ready, pharmacy stated that the prescriptions were ready for pickup.  Given information for orthopedic follow-up again.  Discussed follow-up and return precautions. Final Clinical Impressions(s) / UC Diagnoses   Final diagnoses:  Pain of right lower extremity   Discharge Instructions      We have provided you with crutches today as requested. As discussed, paper prescriptions that were sent on 5/11 are at the pharmacy and ready to be picked up. Call [facility] first thing tomorrow morning to schedule a follow-up appointment regarding your leg injury. Return here as needed.  ED Prescriptions   None    I have reviewed the PDMP during this encounter.   Levora Reas A, NP 03/10/24 1642

## 2024-03-13 ENCOUNTER — Other Ambulatory Visit: Payer: Self-pay | Admitting: Orthopedic Surgery

## 2024-03-20 ENCOUNTER — Other Ambulatory Visit: Payer: Self-pay

## 2024-03-20 ENCOUNTER — Encounter (HOSPITAL_BASED_OUTPATIENT_CLINIC_OR_DEPARTMENT_OTHER): Payer: Self-pay | Admitting: Orthopedic Surgery

## 2024-03-20 NOTE — Anesthesia Preprocedure Evaluation (Signed)
 Anesthesia Evaluation  Patient identified by MRN, date of birth, ID band Patient awake    Reviewed: Allergy & Precautions, NPO status , Patient's Chart, lab work & pertinent test results  History of Anesthesia Complications Negative for: history of anesthetic complications  Airway Mallampati: II  TM Distance: >3 FB Neck ROM: Full    Dental  (+) Dental Advisory Given,    Pulmonary neg shortness of breath, neg sleep apnea, neg COPD, neg recent URI, Current Smoker and Patient abstained from smoking.   Pulmonary exam normal breath sounds clear to auscultation       Cardiovascular negative cardio ROS  Rhythm:Regular Rate:Normal     Neuro/Psych neg Seizures PSYCHIATRIC DISORDERS (ADHD)      H/o concussion    GI/Hepatic negative GI ROS,,,(+)     substance abuse  alcohol use and marijuana use  Endo/Other  negative endocrine ROS    Renal/GU negative Renal ROS     Musculoskeletal   Abdominal   Peds  Hematology negative hematology ROS (+) Lab Results      Component                Value               Date                      WBC                      5.1                 03/03/2024                HGB                      16.2                03/03/2024                HCT                      49.2                03/03/2024                MCV                      85.6                03/03/2024                PLT                      223                 03/03/2024              Anesthesia Other Findings   Reproductive/Obstetrics                             Anesthesia Physical Anesthesia Plan  ASA: 2  Anesthesia Plan: General   Post-op Pain Management: Regional block* and Tylenol  PO (pre-op)*   Induction: Intravenous  PONV Risk Score and Plan: 1 and Ondansetron , Dexamethasone, Midazolam and Treatment may vary due to age or medical condition  Airway Management Planned: LMA  Additional  Equipment:   Intra-op Plan:  Post-operative Plan: Extubation in OR  Informed Consent: I have reviewed the patients History and Physical, chart, labs and discussed the procedure including the risks, benefits and alternatives for the proposed anesthesia with the patient or authorized representative who has indicated his/her understanding and acceptance.     Dental advisory given  Plan Discussed with: CRNA and Anesthesiologist  Anesthesia Plan Comments: (Discussed potential risks of nerve blocks including, but not limited to, infection, bleeding, nerve damage, seizures, pneumothorax, respiratory depression, and potential failure of the block. Alternatives to nerve blocks discussed. All questions answered.  Risks of general anesthesia discussed including, but not limited to, sore throat, hoarse voice, chipped/damaged teeth, injury to vocal cords, nausea and vomiting, allergic reactions, lung infection, heart attack, stroke, and death. All questions answered. )       Anesthesia Quick Evaluation

## 2024-03-20 NOTE — Progress Notes (Signed)
   03/20/24 2956  PAT Phone Screen  Is the patient taking a GLP-1 receptor agonist? No  Do You Have Diabetes? No  Do You Have Hypertension? No  Have You Ever Been to the ER for Asthma? No  Have You Taken Oral Steroids in the Past 3 Months? No  Do you Take Phenteramine or any Other Diet Drugs? No  Recent  Lab Work, EKG, CXR? Yes  Where was this test performed? 03/03/24 [facility] CBC 09/28/23 EKG NSR  Do you have a history of heart problems? No  Any Recent Hospitalizations? No  Height 6\' 2"  (1.88 m)  Weight 93 kg  Pat Appointment Scheduled No  Reason for No Appointment Not Needed

## 2024-03-21 ENCOUNTER — Encounter (HOSPITAL_BASED_OUTPATIENT_CLINIC_OR_DEPARTMENT_OTHER): Payer: Self-pay | Admitting: Orthopedic Surgery

## 2024-03-21 ENCOUNTER — Ambulatory Visit (HOSPITAL_BASED_OUTPATIENT_CLINIC_OR_DEPARTMENT_OTHER)
Admission: RE | Admit: 2024-03-21 | Discharge: 2024-03-21 | Disposition: A | Discharge status: Home / Self Care | Source: Ambulatory Visit | Attending: Orthopedic Surgery | Admitting: Orthopedic Surgery

## 2024-03-21 ENCOUNTER — Other Ambulatory Visit: Payer: Self-pay

## 2024-03-21 ENCOUNTER — Encounter (HOSPITAL_BASED_OUTPATIENT_CLINIC_OR_DEPARTMENT_OTHER)
Admission: RE | Disposition: A | Discharge status: Home / Self Care | Payer: Self-pay | Source: Ambulatory Visit | Attending: Orthopedic Surgery

## 2024-03-21 ENCOUNTER — Ambulatory Visit (HOSPITAL_BASED_OUTPATIENT_CLINIC_OR_DEPARTMENT_OTHER): Payer: Self-pay | Admitting: Anesthesiology

## 2024-03-21 DIAGNOSIS — S93421A Sprain of deltoid ligament of right ankle, initial encounter: Secondary | ICD-10-CM | POA: Insufficient documentation

## 2024-03-21 DIAGNOSIS — X58XXXA Exposure to other specified factors, initial encounter: Secondary | ICD-10-CM | POA: Insufficient documentation

## 2024-03-21 DIAGNOSIS — S8261XA Displaced fracture of lateral malleolus of right fibula, initial encounter for closed fracture: Secondary | ICD-10-CM | POA: Diagnosis not present

## 2024-03-21 DIAGNOSIS — S9304XA Dislocation of right ankle joint, initial encounter: Secondary | ICD-10-CM | POA: Diagnosis present

## 2024-03-21 DIAGNOSIS — S8261XD Displaced fracture of lateral malleolus of right fibula, subsequent encounter for closed fracture with routine healing: Secondary | ICD-10-CM | POA: Diagnosis not present

## 2024-03-21 DIAGNOSIS — F17210 Nicotine dependence, cigarettes, uncomplicated: Secondary | ICD-10-CM | POA: Insufficient documentation

## 2024-03-21 HISTORY — PX: LIGAMENT REPAIR: SHX5444

## 2024-03-21 HISTORY — PX: SYNDESMOSIS REPAIR: SHX5182

## 2024-03-21 HISTORY — PX: ORIF ANKLE FRACTURE: SHX5408

## 2024-03-21 SURGERY — OPEN REDUCTION INTERNAL FIXATION (ORIF) ANKLE FRACTURE
Anesthesia: General | Site: Ankle | Laterality: Right

## 2024-03-21 MED ORDER — ACETAMINOPHEN 500 MG PO TABS
1000.0000 mg | ORAL_TABLET | Freq: Once | ORAL | Status: AC
  Administered 2024-03-21: 1000 mg via ORAL

## 2024-03-21 MED ORDER — FENTANYL CITRATE (PF) 100 MCG/2ML IJ SOLN
INTRAMUSCULAR | Status: AC
  Filled 2024-03-21: qty 2

## 2024-03-21 MED ORDER — FENTANYL CITRATE (PF) 100 MCG/2ML IJ SOLN: 25.0000 ug | INTRAMUSCULAR | Status: DC | PRN

## 2024-03-21 MED ORDER — FENTANYL CITRATE (PF) 100 MCG/2ML IJ SOLN
50.0000 ug | Freq: Once | INTRAMUSCULAR | Status: AC
  Administered 2024-03-21: 50 ug via INTRAVENOUS

## 2024-03-21 MED ORDER — DEXMEDETOMIDINE HCL IN NACL 80 MCG/20ML IV SOLN
INTRAVENOUS | Status: AC
  Filled 2024-03-21: qty 40

## 2024-03-21 MED ORDER — DEXAMETHASONE SODIUM PHOSPHATE 10 MG/ML IJ SOLN
INTRAMUSCULAR | Status: AC
  Filled 2024-03-21: qty 1

## 2024-03-21 MED ORDER — EPHEDRINE 5 MG/ML INJ
INTRAVENOUS | Status: AC
  Filled 2024-03-21: qty 5

## 2024-03-21 MED ORDER — EPHEDRINE SULFATE (PRESSORS) 50 MG/ML IJ SOLN
INTRAMUSCULAR | Status: DC | PRN
  Administered 2024-03-21: 10 mg via INTRAVENOUS

## 2024-03-21 MED ORDER — AMISULPRIDE (ANTIEMETIC) 5 MG/2ML IV SOLN: 10.0000 mg | Freq: Once | INTRAVENOUS | Status: DC | PRN

## 2024-03-21 MED ORDER — ONDANSETRON HCL 4 MG/2ML IJ SOLN
INTRAMUSCULAR | Status: DC | PRN
  Administered 2024-03-21: 4 mg via INTRAVENOUS

## 2024-03-21 MED ORDER — CEFAZOLIN SODIUM-DEXTROSE 2-4 GM/100ML-% IV SOLN
2.0000 g | INTRAVENOUS | Status: AC
  Administered 2024-03-21: 2 g via INTRAVENOUS

## 2024-03-21 MED ORDER — MIDAZOLAM HCL 2 MG/2ML IJ SOLN
INTRAMUSCULAR | Status: AC
Start: 2024-03-21 — End: ?
  Filled 2024-03-21: qty 2

## 2024-03-21 MED ORDER — VANCOMYCIN HCL 500 MG IV SOLR
INTRAVENOUS | Status: DC | PRN
  Administered 2024-03-21: 500 mg via TOPICAL

## 2024-03-21 MED ORDER — BUPIVACAINE LIPOSOME 1.3 % IJ SUSP
INTRAMUSCULAR | Status: DC | PRN
  Administered 2024-03-21: 10 mL via PERINEURAL

## 2024-03-21 MED ORDER — OXYCODONE HCL 5 MG PO TABS
5.0000 mg | ORAL_TABLET | Freq: Once | ORAL | Status: AC | PRN
  Administered 2024-03-21: 5 mg via ORAL

## 2024-03-21 MED ORDER — FENTANYL CITRATE (PF) 100 MCG/2ML IJ SOLN
INTRAMUSCULAR | Status: DC | PRN
  Administered 2024-03-21 (×2): 50 ug via INTRAVENOUS

## 2024-03-21 MED ORDER — KETOROLAC TROMETHAMINE 30 MG/ML IJ SOLN
INTRAMUSCULAR | Status: DC | PRN
  Administered 2024-03-21: 30 mg via INTRAVENOUS

## 2024-03-21 MED ORDER — LIDOCAINE HCL (CARDIAC) PF 100 MG/5ML IV SOSY
PREFILLED_SYRINGE | INTRAVENOUS | Status: DC | PRN
  Administered 2024-03-21: 100 mg via INTRAVENOUS

## 2024-03-21 MED ORDER — 0.9 % SODIUM CHLORIDE (POUR BTL) OPTIME
TOPICAL | Status: DC | PRN
  Administered 2024-03-21: 1000 mL

## 2024-03-21 MED ORDER — DEXMEDETOMIDINE HCL IN NACL 80 MCG/20ML IV SOLN
INTRAVENOUS | Status: DC | PRN
Start: 2024-03-21 — End: 2024-03-21
  Administered 2024-03-21: 8 ug via INTRAVENOUS

## 2024-03-21 MED ORDER — OXYCODONE HCL 5 MG PO TABS
ORAL_TABLET | ORAL | Status: AC
  Filled 2024-03-21: qty 1

## 2024-03-21 MED ORDER — KETOROLAC TROMETHAMINE 30 MG/ML IJ SOLN
INTRAMUSCULAR | Status: AC
  Filled 2024-03-21: qty 1

## 2024-03-21 MED ORDER — PHENYLEPHRINE HCL (PRESSORS) 10 MG/ML IV SOLN
INTRAVENOUS | Status: DC | PRN
  Administered 2024-03-21: 80 ug via INTRAVENOUS
  Administered 2024-03-21: 160 ug via INTRAVENOUS

## 2024-03-21 MED ORDER — RIVAROXABAN 10 MG PO TABS: 10.0000 mg | ORAL_TABLET | Freq: Every day | ORAL | 0 refills | Status: AC

## 2024-03-21 MED ORDER — OXYCODONE HCL 5 MG/5ML PO SOLN: 5.0000 mg | Freq: Once | ORAL | Status: AC | PRN

## 2024-03-21 MED ORDER — ONDANSETRON HCL 4 MG/2ML IJ SOLN
INTRAMUSCULAR | Status: AC
  Filled 2024-03-21: qty 2

## 2024-03-21 MED ORDER — BUPIVACAINE HCL (PF) 0.5 % IJ SOLN
INTRAMUSCULAR | Status: DC | PRN
  Administered 2024-03-21: 10 mL via PERINEURAL

## 2024-03-21 MED ORDER — DEXAMETHASONE SODIUM PHOSPHATE 4 MG/ML IJ SOLN
INTRAMUSCULAR | Status: DC | PRN
Start: 2024-03-21 — End: 2024-03-21
  Administered 2024-03-21: 10 mg via INTRAVENOUS

## 2024-03-21 MED ORDER — PHENYLEPHRINE 80 MCG/ML (10ML) SYRINGE FOR IV PUSH (FOR BLOOD PRESSURE SUPPORT)
PREFILLED_SYRINGE | INTRAVENOUS | Status: AC
  Filled 2024-03-21: qty 10

## 2024-03-21 MED ORDER — PROPOFOL 10 MG/ML IV BOLUS
INTRAVENOUS | Status: DC | PRN
Start: 2024-03-21 — End: 2024-03-21
  Administered 2024-03-21: 250 mg via INTRAVENOUS

## 2024-03-21 MED ORDER — OXYCODONE HCL 5 MG PO TABS: 5.0000 mg | ORAL_TABLET | Freq: Four times a day (QID) | ORAL | 0 refills | Status: AC | PRN

## 2024-03-21 MED ORDER — ACETAMINOPHEN 500 MG PO TABS
ORAL_TABLET | ORAL | Status: AC
  Filled 2024-03-21: qty 2

## 2024-03-21 MED ORDER — BUPIVACAINE HCL (PF) 0.25 % IJ SOLN
INTRAMUSCULAR | Status: DC | PRN
  Administered 2024-03-21: 15 mL via PERINEURAL

## 2024-03-21 MED ORDER — LACTATED RINGERS IV SOLN: INTRAVENOUS | Status: DC

## 2024-03-21 MED ORDER — MIDAZOLAM HCL 2 MG/2ML IJ SOLN
2.0000 mg | Freq: Once | INTRAMUSCULAR | Status: AC
  Administered 2024-03-21: 2 mg via INTRAVENOUS

## 2024-03-21 MED ORDER — CEFAZOLIN SODIUM-DEXTROSE 2-4 GM/100ML-% IV SOLN
INTRAVENOUS | Status: AC
  Filled 2024-03-21: qty 100

## 2024-03-21 SURGICAL SUPPLY — 66 items
BANDAGE ESMARK 6X9 LF (GAUZE/BANDAGES/DRESSINGS) IMPLANT
BIT DRILL 2.5X2.75 QC CALB (BIT) IMPLANT
BIT DRILL 3.5X5.5 QC CALB (BIT) IMPLANT
BLADE SURG 15 STRL LF DISP TIS (BLADE) ×2 IMPLANT
BNDG ELASTIC 4INX 5YD STR LF (GAUZE/BANDAGES/DRESSINGS) ×1 IMPLANT
BNDG ELASTIC 6INX 5YD STR LF (GAUZE/BANDAGES/DRESSINGS) ×1 IMPLANT
BNDG ESMARK 4X9 LF (GAUZE/BANDAGES/DRESSINGS) IMPLANT
CANISTER SUCT 1200ML W/VALVE (MISCELLANEOUS) ×1 IMPLANT
CHLORAPREP W/TINT 26 (MISCELLANEOUS) ×1 IMPLANT
COVER BACK TABLE 60X90IN (DRAPES) ×1 IMPLANT
CUFF TRNQT CYL 34X4.125X (TOURNIQUET CUFF) ×1 IMPLANT
DRAPE EXTREMITY T 121X128X90 (DISPOSABLE) ×1 IMPLANT
DRAPE OEC MINIVIEW 54X84 (DRAPES) ×1 IMPLANT
DRAPE U-SHAPE 47X51 STRL (DRAPES) ×1 IMPLANT
DRSG MEPITEL 4X7.2 (GAUZE/BANDAGES/DRESSINGS) ×1 IMPLANT
ELECTRODE REM PT RTRN 9FT ADLT (ELECTROSURGICAL) ×1 IMPLANT
FIXATION ZIPTIGHT ANKLE SNDSMS (Ankle) IMPLANT
GAUZE PAD ABD 8X10 STRL (GAUZE/BANDAGES/DRESSINGS) ×2 IMPLANT
GAUZE SPONGE 4X4 12PLY STRL (GAUZE/BANDAGES/DRESSINGS) ×1 IMPLANT
GLOVE BIO SURGEON STRL SZ8 (GLOVE) ×1 IMPLANT
GLOVE BIOGEL PI IND STRL 8 (GLOVE) ×2 IMPLANT
GLOVE ECLIPSE 8.0 STRL XLNG CF (GLOVE) ×1 IMPLANT
GOWN STRL REUS W/ TWL LRG LVL3 (GOWN DISPOSABLE) ×1 IMPLANT
GOWN STRL REUS W/ TWL XL LVL3 (GOWN DISPOSABLE) ×2 IMPLANT
KWIRE DBL .062X4 NSTRL (WIRE) IMPLANT
NDL HYPO 22X1.5 SAFETY MO (MISCELLANEOUS) IMPLANT
NDL SUT 6 .5 CRC .975X.05 MAYO (NEEDLE) IMPLANT
NEEDLE HYPO 22X1.5 SAFETY MO (MISCELLANEOUS) IMPLANT
NS IRRIG 1000ML POUR BTL (IV SOLUTION) ×1 IMPLANT
PACK BASIN DAY SURGERY FS (CUSTOM PROCEDURE TRAY) ×1 IMPLANT
PAD CAST 4YDX4 CTTN HI CHSV (CAST SUPPLIES) ×1 IMPLANT
PADDING CAST ABS COTTON 4X4 ST (CAST SUPPLIES) IMPLANT
PADDING CAST COTTON 6X4 STRL (CAST SUPPLIES) ×1 IMPLANT
PASSER SUT SWANSON 36MM LOOP (INSTRUMENTS) IMPLANT
PENCIL SMOKE EVACUATOR (MISCELLANEOUS) ×1 IMPLANT
PLATE ACE 100DEG 6HOLE (Plate) IMPLANT
RETRIEVER SUT HEWSON (MISCELLANEOUS) IMPLANT
SANITIZER HAND PURELL FF 515ML (MISCELLANEOUS) ×1 IMPLANT
SCREW CORTICAL 3.5MM 12MM (Screw) IMPLANT
SCREW CORTICAL 3.5MM 16MM (Screw) IMPLANT
SCREW CORTICAL 3.5MM 18MM (Screw) IMPLANT
SCREW CORTICAL 3.5MM 20MM (Screw) IMPLANT
SCREW CORTICAL 3.5MM 28MM (Screw) IMPLANT
SHEET MEDIUM DRAPE 40X70 STRL (DRAPES) ×1 IMPLANT
SLEEVE SCD COMPRESS KNEE MED (STOCKING) ×1 IMPLANT
SPIKE FLUID TRANSFER (MISCELLANEOUS) IMPLANT
SPLINT PLASTER CAST FAST 5X30 (CAST SUPPLIES) ×20 IMPLANT
SPONGE T-LAP 18X18 ~~LOC~~+RFID (SPONGE) ×1 IMPLANT
STOCKINETTE 6 STRL (DRAPES) ×1 IMPLANT
SUCTION TUBE FRAZIER 10FR DISP (SUCTIONS) ×1 IMPLANT
SUT ETHIBOND 0 MO6 C/R (SUTURE) IMPLANT
SUT ETHIBOND 2 OS 4 DA (SUTURE) IMPLANT
SUT ETHILON 3 0 PS 1 (SUTURE) ×1 IMPLANT
SUT MNCRL AB 3-0 PS2 18 (SUTURE) ×1 IMPLANT
SUT VIC AB 0 CT1 27XBRD ANBCTR (SUTURE) IMPLANT
SUT VIC AB 2-0 CT1 TAPERPNT 27 (SUTURE) IMPLANT
SUT VIC AB 2-0 SH 27XBRD (SUTURE) ×1 IMPLANT
SUT VICRYL 0 SH 27 (SUTURE) IMPLANT
SUT VICRYL 0 UR6 27IN ABS (SUTURE) IMPLANT
SUTURE FIBERWR #2 38 T-5 BLUE (SUTURE) IMPLANT
SUTURE FIBERWR 2-0 18 17.9 3/8 (SUTURE) IMPLANT
SYR BULB EAR ULCER 3OZ GRN STR (SYRINGE) ×1 IMPLANT
SYR CONTROL 10ML LL (SYRINGE) IMPLANT
TOWEL GREEN STERILE FF (TOWEL DISPOSABLE) ×2 IMPLANT
TUBE CONNECTING 20X1/4 (TUBING) ×1 IMPLANT
UNDERPAD 30X36 HEAVY ABSORB (UNDERPADS AND DIAPERS) ×1 IMPLANT

## 2024-03-21 NOTE — Anesthesia Procedure Notes (Signed)
 Procedure Name: LMA Insertion Date/Time: 03/21/2024 1:15 PM  Performed by: Junius Olive, CRNAPre-anesthesia Checklist: Patient identified, Emergency Drugs available, Suction available and Patient being monitored Patient Re-evaluated:Patient Re-evaluated prior to induction Oxygen Delivery Method: Circle system utilized Preoxygenation: Pre-oxygenation with 100% oxygen Induction Type: IV induction Ventilation: Mask ventilation without difficulty LMA: LMA inserted LMA Size: 5.0 Number of attempts: 1 Airway Equipment and Method: Bite block Placement Confirmation: positive ETCO2 Tube secured with: Tape Dental Injury: Teeth and Oropharynx as per pre-operative assessment

## 2024-03-21 NOTE — Discharge Instructions (Addendum)
 Amada Backer, MD [facility]  Please read the following information regarding your care after surgery.  Medications  You only need a prescription for the narcotic pain medicine (ex. oxycodone , Percocet, Norco).  All of the other medicines listed below are available over the counter. X Aleve 2 pills twice a day for the first 3 days after surgery. X acetominophen (Tylenol ) 650 mg every 4-6 hours as you need for minor to moderate pain X oxycodone  as prescribed for severe pain  Narcotic pain medicine (ex. oxycodone , Percocet, Vicodin) will cause constipation.  To prevent this problem, take the following medicines while you are taking any pain medicine. X docusate sodium  (Colace) 100 mg twice a day X senna (Senokot) 2 tablets twice a day  X To help prevent blood clots, take Xarelto daily for two weeks.  Then take a baby aspirin (81 mg) twice a day for four weeks.  You should also get up every hour while you are awake to move around.    Weight Bearing X Do not bear any weight on the operated leg or foot.  Cast / Splint / Dressing X Keep your splint, cast or dressing clean and dry.  Don't put anything (coat hanger, pencil, etc) down inside of it.  If it gets damp, use a hair dryer on the cool setting to dry it.  If it gets soaked, call the office to schedule an appointment for a cast change.  After your dressing, cast or splint is removed; you may shower, but do not soak or scrub the wound.  Allow the water to run over it, and then gently pat it dry.  Swelling It is normal for you to have swelling where you had surgery.  To reduce swelling and pain, keep your toes above your nose for at least 3 days after surgery.  It may be necessary to keep your foot or leg elevated for several weeks.  If it hurts, it should be elevated.  Follow Up Call my office at [phone] when you are discharged from the hospital or surgery center to schedule an appointment to be seen two weeks after surgery.  Call my  office at [phone] if you develop a fever >101.5 F, nausea, vomiting, bleeding from the surgical site or severe pain.     Post Anesthesia Home Care Instructions Next tylenol  dose after 5:45pm Activity: Get plenty of rest for the remainder of the day. A responsible individual must stay with you for 24 hours following the procedure.  For the next 24 hours, DO NOT: -Drive a car -Advertising copywriter -Drink alcoholic beverages -Take any medication unless instructed by your physician -Make any legal decisions or sign important papers.  Meals: Start with liquid foods such as gelatin or soup. Progress to regular foods as tolerated. Avoid greasy, spicy, heavy foods. If nausea and/or vomiting occur, drink only clear liquids until the nausea and/or vomiting subsides. Call your physician if vomiting continues.  Special Instructions/Symptoms: Your throat may feel dry or sore from the anesthesia or the breathing tube placed in your throat during surgery. If this causes discomfort, gargle with warm salt water. The discomfort should disappear within 24 hours.  If you had a scopolamine patch placed behind your ear for the management of post- operative nausea and/or vomiting:  1. The medication in the patch is effective for 72 hours, after which it should be removed.  Wrap patch in a tissue and discard in the trash. Wash hands thoroughly with soap and water. 2. You may remove  the patch earlier than 72 hours if you experience unpleasant side effects which may include dry mouth, dizziness or visual disturbances. 3. Avoid touching the patch. Wash your hands with soap and water after contact with the patch.   Information for Discharge Teaching: EXPAREL (bupivacaine liposome injectable suspension)   Pain relief is important to your recovery. The goal is to control your pain so you can move easier and return to your normal activities as soon as possible after your procedure. Your physician may use several  types of medicines to manage pain, swelling, and more.  Your surgeon or anesthesiologist gave you EXPAREL(bupivacaine) to help control your pain after surgery.  EXPAREL is a local anesthetic designed to release slowly over an extended period of time to provide pain relief by numbing the tissue around the surgical site. EXPAREL is designed to release pain medication over time and can control pain for up to 72 hours. Depending on how you respond to EXPAREL, you may require less pain medication during your recovery. EXPAREL can help reduce or eliminate the need for opioids during the first few days after surgery when pain relief is needed the most. EXPAREL is not an opioid and is not addictive. It does not cause sleepiness or sedation.   Important! A teal colored band has been placed on your arm with the date, time and amount of EXPAREL you have received. Please leave this armband in place for the full 96 hours following administration, and then you may remove the band. If you return to the hospital for any reason within 96 hours following the administration of EXPAREL, the armband provides important information that your health care providers to know, and alerts them that you have received this anesthetic.    Possible side effects of EXPAREL: Temporary loss of sensation or ability to move in the area where medication was injected. Nausea, vomiting, constipation Rarely, numbness and tingling in your mouth or lips, lightheadedness, or anxiety may occur. Call your doctor right away if you think you may be experiencing any of these sensations, or if you have other questions regarding possible side effects.  Follow all other discharge instructions given to you by your surgeon or nurse. Eat a healthy diet and drink plenty of water or other fluids. Post Anesthesia Home Care Instructions  Activity: Get plenty of rest for the remainder of the day. A responsible individual must stay with you for 24 hours  following the procedure.  For the next 24 hours, DO NOT: -Drive a car -Advertising copywriter -Drink alcoholic beverages -Take any medication unless instructed by your physician -Make any legal decisions or sign important papers.  Meals: Start with liquid foods such as gelatin or soup. Progress to regular foods as tolerated. Avoid greasy, spicy, heavy foods. If nausea and/or vomiting occur, drink only clear liquids until the nausea and/or vomiting subsides. Call your physician if vomiting continues.  Special Instructions/Symptoms: Your throat may feel dry or sore from the anesthesia or the breathing tube placed in your throat during surgery. If this causes discomfort, gargle with warm salt water. The discomfort should disappear within 24 hours.  If you had a scopolamine patch placed behind your ear for the management of post- operative nausea and/or vomiting:  1. The medication in the patch is effective for 72 hours, after which it should be removed.  Wrap patch in a tissue and discard in the trash. Wash hands thoroughly with soap and water. 2. You may remove the patch earlier than 72 hours  if you experience unpleasant side effects which may include dry mouth, dizziness or visual disturbances. 3. Avoid touching the patch. Wash your hands with soap and water after contact with the patch.    Post Anesthesia Home Care Instructions  Activity: Get plenty of rest for the remainder of the day. A responsible individual must stay with you for 24 hours following the procedure.  For the next 24 hours, DO NOT: -Drive a car -Advertising copywriter -Drink alcoholic beverages -Take any medication unless instructed by your physician -Make any legal decisions or sign important papers.  Meals: Start with liquid foods such as gelatin or soup. Progress to regular foods as tolerated. Avoid greasy, spicy, heavy foods. If nausea and/or vomiting occur, drink only clear liquids until the nausea and/or vomiting  subsides. Call your physician if vomiting continues.  Special Instructions/Symptoms: Your throat may feel dry or sore from the anesthesia or the breathing tube placed in your throat during surgery. If this causes discomfort, gargle with warm salt water. The discomfort should disappear within 24 hours.  If you had a scopolamine patch placed behind your ear for the management of post- operative nausea and/or vomiting:  1. The medication in the patch is effective for 72 hours, after which it should be removed.  Wrap patch in a tissue and discard in the trash. Wash hands thoroughly with soap and water. 2. You may remove the patch earlier than 72 hours if you experience unpleasant side effects which may include dry mouth, dizziness or visual disturbances. 3. Avoid touching the patch. Wash your hands with soap and water after contact with the patch.

## 2024-03-21 NOTE — Progress Notes (Signed)
Assisted Dr. Jennifer Allan with right, adductor canal, popliteal, ultrasound guided block. Side rails up, monitors on throughout procedure. See vital signs in flow sheet. Tolerated Procedure well. 

## 2024-03-21 NOTE — H&P (Signed)
 Erik Benton is an 33 y.o. male.   Chief Complaint: Right ankle pain HPI: 33 year old male with past medical history significant for smoking injured his right ankle about 2 and half weeks ago.  He has a displaced lateral malleolus fracture and likely injury to his deltoid ligament.  He presents now for surgical treatment of this displaced and unstable right ankle injury.  Past Medical History:  Diagnosis Date   ADHD (attention deficit hyperactivity disorder)    Concussion     Past Surgical History:  Procedure Laterality Date   WRIST FRACTURE SURGERY     WRIST SURGERY      History reviewed. No pertinent family history. Social History:  reports that he has been smoking cigarettes. He has never used smokeless tobacco. He reports current alcohol use. He reports current drug use. Drug: Marijuana.  Allergies: No Known Allergies  Medications Prior to Admission  Medication Sig Dispense Refill   oxyCODONE -acetaminophen  (PERCOCET/ROXICET) 5-325 MG tablet Take 1 tablet by mouth every 6 (six) hours as needed for severe pain (pain score 7-10). 12 tablet 0   triamcinolone  (KENALOG ) 0.1 % paste Use as directed 1 Application in the mouth or throat 2 (two) times daily. 5 g 1    No results found for this or any previous visit (from the past 48 hours). No results found.  Review of Systems no recent fever, chills, nausea, vomiting or changes in his appetite  Blood pressure 116/77, pulse 66, temperature 98.3 F (36.8 C), temperature source Temporal, resp. rate 18, height 6\' 2"  (1.88 m), weight 88.6 kg, SpO2 100%. Physical Exam  Well-nourished well-developed man in no apparent distress.  Alert and oriented.  Normal mood and affect.  Gait is nonweightbearing on the right.  The right ankle has moderate swelling.  Skin is intact.  Pulses are palpable in the foot.  No lymphadenopathy.  Intact sensibility to light touch in the sural and saphenous nerve distributions.   Assessment/Plan Right ankle  lateral malleolus fracture and deltoid ligament rupture -to the operating room today for open treatment with internal fixation and possible repair of the deltoid ligament and/or syndesmosis.  The risks and benefits of the alternative treatment options have been discussed in detail.  The patient wishes to proceed with surgery and specifically understands risks of bleeding, infection, nerve damage, blood clots, need for additional surgery, amputation and death.   Amada Backer, MD 02-Apr-2024, 11:08 AM

## 2024-03-21 NOTE — Anesthesia Procedure Notes (Signed)
 Anesthesia Regional Block: Popliteal block   Pre-Anesthetic Checklist: , timeout performed,  Correct Patient, Correct Site, Correct Laterality,  Correct Procedure, Correct Position, site marked,  Risks and benefits discussed,  Surgical consent,  Pre-op evaluation,  At surgeon's request and post-op pain management  Laterality: Right  Prep: chloraprep       Needles:  Injection technique: Single-shot  Needle Type: Echogenic Stimulator Needle     Needle Length: 9cm  Needle Gauge: 21     Additional Needles:   Procedures:,,,, ultrasound used (permanent image in chart),,    Narrative:  Start time: 03/21/2024 11:45 AM End time: 03/21/2024 11:50 AM Injection made incrementally with aspirations every 5 mL.  Performed by: Personally  Anesthesiologist: Conard Decent, MD  Additional Notes: Discussed risks and benefits of nerve block including, but not limited to, prolonged and/or permanent nerve injury involving sensory and/or motor function. Monitors were applied and a time-out was performed. The nerve and associated structures were visualized under ultrasound guidance. After negative aspiration, local anesthetic was slowly injected around the nerve. There was no evidence of high pressure during the procedure. There were no paresthesias. VSS remained stable and the patient tolerated the procedure well.

## 2024-03-21 NOTE — Transfer of Care (Signed)
 Immediate Anesthesia Transfer of Care Note  Patient: Erik Benton  Procedure(s) Performed: OPEN REDUCTION INTERNAL FIXATION (ORIF) ANKLE FRACTURE (Right: Ankle) REPAIR, LIGAMENT (Right: Ankle) REPAIR, SYNDESMOSIS, ANKLE (Right: Ankle)  Patient Location: PACU  Anesthesia Type:General  Level of Consciousness: sedated  Airway & Oxygen Therapy: Patient Spontanous Breathing and Patient connected to nasal cannula oxygen  Post-op Assessment: Report given to RN  Post vital signs: Reviewed and stable  Last Vitals:  Vitals Value Taken Time  BP 112/71 03/21/24 1434  Temp    Pulse 59 03/21/24 1435  Resp 12 03/21/24 1435  SpO2 100 % 03/21/24 1435  Vitals shown include unfiled device data.  Last Pain:  Vitals:   03/21/24 1042  TempSrc: Temporal  PainSc: 9          Complications: No notable events documented.

## 2024-03-21 NOTE — Anesthesia Procedure Notes (Signed)
 Anesthesia Regional Block: Adductor canal block   Pre-Anesthetic Checklist: , timeout performed,  Correct Patient, Correct Site, Correct Laterality,  Correct Procedure, Correct Position, site marked,  Risks and benefits discussed,  Surgical consent,  Pre-op evaluation,  At surgeon's request and post-op pain management  Laterality: Right  Prep: chloraprep       Needles:  Injection technique: Single-shot  Needle Type: Echogenic Stimulator Needle     Needle Length: 9cm  Needle Gauge: 21     Additional Needles:   Procedures:,,,, ultrasound used (permanent image in chart),,    Narrative:  Start time: 03/21/2024 11:50 AM End time: 03/21/2024 11:53 AM Injection made incrementally with aspirations every 5 mL.  Performed by: Personally  Anesthesiologist: Conard Decent, MD  Additional Notes: Discussed risks and benefits of nerve block including, but not limited to, prolonged and/or permanent nerve injury involving sensory and/or motor function. Monitors were applied and a time-out was performed. The nerve and associated structures were visualized under ultrasound guidance. After negative aspiration, local anesthetic was slowly injected around the nerve. There was no evidence of high pressure during the procedure. There were no paresthesias. VSS remained stable and the patient tolerated the procedure well.

## 2024-03-21 NOTE — Anesthesia Postprocedure Evaluation (Signed)
 Anesthesia Post Note  Patient: Erik Benton  Procedure(s) Performed: OPEN REDUCTION INTERNAL FIXATION (ORIF) ANKLE FRACTURE (Right: Ankle) REPAIR, LIGAMENT (Right: Ankle) REPAIR, SYNDESMOSIS, ANKLE (Right: Ankle)     Patient location during evaluation: PACU Anesthesia Type: General and Regional Level of consciousness: awake Pain management: pain level controlled Vital Signs Assessment: post-procedure vital signs reviewed and stable Respiratory status: spontaneous breathing, nonlabored ventilation and respiratory function stable Cardiovascular status: blood pressure returned to baseline and stable Postop Assessment: no apparent nausea or vomiting Anesthetic complications: no   No notable events documented.  Last Vitals:  Vitals:   03/21/24 1200 03/21/24 1445  BP: 117/74 121/77  Pulse: 65 64  Resp: 16 15  Temp:    SpO2: 100% 100%    Last Pain:  Vitals:   03/21/24 1435  TempSrc:   PainSc: 5                  Conard Decent

## 2024-03-21 NOTE — Op Note (Signed)
 03/21/2024  2:29 PM  PATIENT:  Erik Benton  33 y.o. male  PRE-OPERATIVE DIAGNOSIS:   1.  Closed dislocation of right ankle, initial encounter      2.  Right ankle lateral malleolus fracture  POST-OPERATIVE DIAGNOSIS:   1.  Closed dislocation of right ankle, initial encounter      2.  Right ankle lateral malleolus fracture      3.  Right ankle syndesmosis sprain      4.  Right ankle deltoid ligament sprain  Procedure(s):  1.  Open treatment right ankle lateral malleolus fracture with internal fixation   2.  Stress exam of right ankle under fluoro   3.  Open treatment right ankle syndesmosis with internal fixation   4.  Repair of deltoid ligament right ankle   5.  AP, mortise and lateral xrays of the right ankle  SURGEON:  Amada Backer, MD  ASSISTANT: none  ANESTHESIA:   General, regional  EBL:  minimal   TOURNIQUET:   Total Tourniquet Time Documented: Thigh (Right) - 49 minutes Total: Thigh (Right) - 49 minutes  COMPLICATIONS:  None apparent  DISPOSITION:  Extubated, awake and stable to recovery.  INDICATION FOR PROCEDURE: 33 year old male injured his right ankle about 2 weeks ago.  He sustained an ankle dislocation and underwent closed reduction in the emergency room with splinting.  He presents now for a planned return to the operating room for open treatment of his lateral malleolus fracture and possible syndesmosis as well as deltoid ligament repair.  The risks and benefits of the alternative treatment options have been discussed in detail.  The patient wishes to proceed with surgery and specifically understands risks of bleeding, infection, nerve damage, blood clots, need for additional surgery, amputation and death.   PROCEDURE IN DETAIL:  After pre operative consent was obtained, and the correct operative site was identified, the patient was brought to the operating room and placed supine on the OR table.  Anesthesia was administered.  Pre-operative antibiotics were  administered.  A surgical timeout was taken.  The right lower extremity was prepped and draped in standard sterile fashion with a tourniquet around the thigh.  The extremity was elevated, and the tourniquet was inflated to 250 mmHg.  A longitudinal incision was made over the lateral malleolus.  Dissection was carried sharply down through the subcutaneous tissues.  The fracture site was identified.  It was cleaned of all hematoma and periosteum.  It was irrigated.  The fracture was reduced and held with a lobster claw clamp and a pointed tenaculum.  A 3.5 mm lag screw was inserted from anterior to posterior across the fracture site.  It was noted to have excellent purchase.  A 6-hole one third tubular plate from the [employer] small frag set was contoured to fit the lateral malleolus.  It was secured distally with 3 unicortical screws and proximally with 2 bicortical screws.  A mortise radiograph was obtained.  Dorsiflexion and external rotation stress was applied to the supinated forefoot.  There was widening evident at the medial clear space indicating syndesmosis instability.  The decision was made to reduce and fix the syndesmosis.  A drill hole was made adjacent to the physeal scar across all 4 cortices of the distal fibula and tibia.  A [employer] zip tight suture button device was passed through to the medial cortex and toggled appropriately.  A small incision was made to ensure that the toggle seated deep to the periosteum.  The  ankle was dorsiflexed to neutral and the zip tight securely fastened.  The stress examination was repeated.  The syndesmosis appeared stable.  There was appreciable talar tilt indicating deltoid ligament instability.  The medial incision was extended distally to expose the superficial deltoid.  It was noted to be disrupted from the anterior border to about two thirds of the way back.  The deltoid was then repaired with figure-of-eight sutures of 0 Vicryl.  AP, mortise and  lateral radiographs confirmed appropriate reduction of the joint and appropriate position and length of all hardware.  Medial and lateral wounds were irrigated copiously and sprinkled with vancomycin powder.  Subcutaneous tissues were approximated with inverted simple sutures of 2-0 Vicryl and 3-0 Monocryl.  Skin incisions were closed with running 3-0 nylon.  Sterile dressings were applied followed by well-padded short leg splint.  The tourniquet was released after application of the dressings.  The patient was awakened from anesthesia and transported to the recovery room in stable condition.   FOLLOW UP PLAN: Nonweightbearing on the right lower extremity in a short leg splint.  Xarelto for DVT prophylaxis due to history of smoking.  Follow-up in 2 weeks for suture removal and conversion to a short leg cast.  Plan 6 weeks postoperative nonweightbearing immobilization.   RADIOGRAPHS: AP, mortise and lateral radiographs of the right ankle show interval reduction and fixation of the lateral malleolus fracture and syndesmosis.  Hardware is appropriately positioned and of the appropriate lengths.  No other acute injuries are noted.

## 2024-03-22 ENCOUNTER — Encounter (HOSPITAL_BASED_OUTPATIENT_CLINIC_OR_DEPARTMENT_OTHER): Payer: Self-pay | Admitting: Orthopedic Surgery

## 2024-11-15 ENCOUNTER — Emergency Department (HOSPITAL_COMMUNITY)
Admission: EM | Admit: 2024-11-15 | Discharge: 2024-11-15 | Disposition: A | Discharge status: Home / Self Care | Payer: MEDICAID | Attending: Emergency Medicine | Admitting: Emergency Medicine

## 2024-11-15 DIAGNOSIS — J3489 Other specified disorders of nose and nasal sinuses: Secondary | ICD-10-CM | POA: Insufficient documentation

## 2024-11-15 DIAGNOSIS — Z7901 Long term (current) use of anticoagulants: Secondary | ICD-10-CM | POA: Insufficient documentation

## 2024-11-15 DIAGNOSIS — R300 Dysuria: Secondary | ICD-10-CM | POA: Insufficient documentation

## 2024-11-15 DIAGNOSIS — Z202 Contact with and (suspected) exposure to infections with a predominantly sexual mode of transmission: Secondary | ICD-10-CM | POA: Diagnosis not present

## 2024-11-15 DIAGNOSIS — R0981 Nasal congestion: Secondary | ICD-10-CM | POA: Diagnosis not present

## 2024-11-15 LAB — URINALYSIS, W/ REFLEX TO CULTURE (INFECTION SUSPECTED)
Bacteria, UA: NONE SEEN
Bilirubin Urine: NEGATIVE
Glucose, UA: NEGATIVE mg/dL
Ketones, ur: NEGATIVE mg/dL
Leukocytes,Ua: NEGATIVE
Nitrite: NEGATIVE
Protein, ur: NEGATIVE mg/dL
Specific Gravity, Urine: 1.024 (ref 1.005–1.030)
pH: 7 (ref 5.0–8.0)

## 2024-11-15 MED ORDER — LIDOCAINE HCL (PF) 1 % IJ SOLN
2.0000 mL | Freq: Once | INTRAMUSCULAR | Status: AC
  Administered 2024-11-15: 2 mL
  Filled 2024-11-15: qty 30

## 2024-11-15 MED ORDER — CEFTRIAXONE SODIUM 1 G IJ SOLR
500.0000 mg | Freq: Once | INTRAMUSCULAR | Status: AC
  Administered 2024-11-15: 500 mg via INTRAMUSCULAR
  Filled 2024-11-15: qty 10

## 2024-11-15 MED ORDER — DOXYCYCLINE HYCLATE 100 MG PO CAPS: 100.0000 mg | ORAL_CAPSULE | Freq: Two times a day (BID) | ORAL | 0 refills | Status: AC

## 2024-11-15 MED ORDER — DOXYCYCLINE HYCLATE 100 MG PO TABS
100.0000 mg | ORAL_TABLET | Freq: Once | ORAL | Status: AC
  Administered 2024-11-15: 100 mg via ORAL
  Filled 2024-11-15: qty 1

## 2024-11-15 NOTE — ED Triage Notes (Addendum)
 Patient wanting STD check, dysuria, wife told she had chlamydia. Patient is alert and oriented x 4. Airway patent, respirations even and unlabored. Skin normal, warm and dry. Patient has no new complaints at this time. Siderails up x 2. Call light at bedside.

## 2024-11-15 NOTE — ED Provider Notes (Signed)
 " [place] EMERGENCY DEPARTMENT AT [facility] Provider Note   CSN: [id number] Arrival date & time: 11/15/24  1618     History Chief Complaint  Patient presents with   Exposure to STD    HPI: Erik Benton is a 34 y.o. male with no pertinent history who presents complaining of chlamydia exposure.. Patient arrived via POV accompanied by male partner and 3 children.  History provided by patient and spouse/partner.  No interpreter required during this encounter.  Patient is presenting with partner.  Patient and partner are currently sexually active, and patient's partner recently tested positive for chlamydia.  Patient reports that he has had some recent dysuria, denies any abdominal pain, fevers, chills, chest pain, shortness of breath.  Has had some recent congestion, rhinorrhea, however no chest pain, shortness of breath.  Patient would like empiric treatment for doxycycline  and chlamydia.  Patient's recorded medical, surgical, social, medication list and allergies were reviewed in the Snapshot window as part of the initial history.   Prior to Admission medications  Medication Sig Start Date End Date Taking? Authorizing Provider  rivaroxaban  (XARELTO ) 10 MG TABS tablet Take 1 tablet (10 mg total) by mouth daily for 14 days. 03/22/24 04/05/24  Kit Rush, MD  triamcinolone  (KENALOG ) 0.1 % paste Use as directed 1 Application in the mouth or throat 2 (two) times daily. 09/28/23   Rolinda Rogue, MD     Allergies: Patient has no known allergies.   Review of Systems   ROS as per HPI  Physical Exam Updated Vital Signs BP 114/74 (BP Location: Right Arm)   Pulse 77   Temp 98.1 F (36.7 C) (Oral)   Resp 18   SpO2 100%  Physical Exam Vitals and nursing note reviewed.  Constitutional:      General: He is not in acute distress.    Appearance: He is well-developed.  HENT:     Head: Normocephalic and atraumatic.  Eyes:     Conjunctiva/sclera: Conjunctivae normal.   Cardiovascular:     Rate and Rhythm: Normal rate and regular rhythm.     Heart sounds: No murmur heard. Pulmonary:     Effort: Pulmonary effort is normal. No respiratory distress.     Breath sounds: Normal breath sounds.  Abdominal:     Palpations: Abdomen is soft.     Tenderness: There is no abdominal tenderness.  Musculoskeletal:        General: No swelling.     Cervical back: Neck supple.  Skin:    General: Skin is warm and dry.     Capillary Refill: Capillary refill takes less than 2 seconds.  Neurological:     Mental Status: He is alert.     Gait: Gait normal.  Psychiatric:        Mood and Affect: Mood normal.     ED Course/ Medical Decision Making/ A&P    Procedures Procedures   Medications Ordered in ED Medications  cefTRIAXone  (ROCEPHIN ) injection 500 mg (has no administration in time range)  lidocaine  (PF) (XYLOCAINE ) 1 % injection 2 mL (has no administration in time range)  doxycycline  (VIBRA -TABS) tablet 100 mg (has no administration in time range)    Medical Decision Making:   Erik Benton is a 34 y.o. male who presents for known chlamydia exposure as per above.  Physical exam is pertinent for no abdominal tenderness to palpation.   The differential includes but is not limited to UTI, STI, STI exposure.  Independent historian: Spouse/partner  External data reviewed: No pertinent external data  Initial Plan:  Screening gonorrhea/chlamydia Urinalysis with reflex culture ordered to evaluate for UTI or relevant urologic/nephrologic pathology.  Objective evaluation as below reviewed   Labs: Ordered, Independent interpretation, and Details: UA without nitrites or leukocyte esterase or significant WBCs, not consistent with UTI.  Radiology: Not indicated No results found.  EKG/Medicine tests: Not indicated EKG Interpretation:    Interventions: Ceftriaxone /lidocaine , doxycycline   See the EMR for full details regarding lab and imaging  results.  Patient presents for chlamydia exposure.  Also has some mild symptoms of URI including congestion, rhinorrhea, however is vitally stable, well-appearing, no systemic symptoms, abdomen soft, nontender.  Patient does have dysuria, therefore we will check for UTI, as well as send off test for GC/C.  Patient does want empiric treatment for gonorrhea and chlamydia, therefore ceftriaxone  as well as doxycycline  provided while in the ED.  UA without evidence of UTI, therefore patient does not need additional UTI coverage.  Recommended completion of course of antibiotics, following up GC/C results with his PCP, and test of cure before he and partner resume sexual activity.  Patient expressed understanding, discharged in stable condition..  Presentation is most consistent with acute complicated illness  Discussion of management or test interpretations with external provider(s): Not indicated  Risk Drugs:Prescription drug management  Disposition: DISCHARGE: I believe that the patient is safe for discharge home with outpatient follow-up. Patient was informed of all pertinent physical exam, laboratory, and imaging findings. Patient's suspected etiology of their symptom presentation was discussed with the patient and all questions were answered. We discussed following up with PCP. I provided thorough ED return precautions. The patient feels safe and comfortable with this plan.  MDM generated using voice dictation software and may contain dictation errors.  Please contact me for any clarification or with any questions.  Clinical Impression:  1. Dysuria   2. Exposure to chlamydia      Data Unavailable   Final Clinical Impression(s) / ED Diagnoses Final diagnoses:  Dysuria  Exposure to chlamydia    Rx / DC Orders ED Discharge Orders     None        Rogelia Jerilynn RAMAN, MD 11/18/24 1411  "

## 2024-11-15 NOTE — Discharge Instructions (Addendum)
 Erik Benton  Thank you for allowing us  to take care of you today.  You came to the Emergency Department today because your partner was recently positive for chlamydia.  You have had some pain with urination.  You did not have a bladder infection on your urine.  However given you have a known exposure we treated you empirically for both gonorrhea and chlamydia.  You do not need any additional treatment for gonorrhea, this is a one-time dose of an injection antibiotic, to treat chlamydia, it will be a 10-day course of doxycycline  which she will take by mouth.  You should refrain from any sexual activity with your partner until both of you have completed your antibiotics.  After completion of your antibiotics, if your first test was positive, you should get a repeat test to confirm that you are negative before you and your partner resume sexual activity. To-Do: 1. Please follow-up with your primary doctor within 1 - 2 weeks / as soon as possible.  Please return to the Emergency Department or call 911 if you experience have worsening of your symptoms, or do not get better, chest pain, shortness of breath, severe or significantly worsening pain, high fever, severe confusion, pass out or have any reason to think that you need emergency medical care.   We hope you feel better soon.   Mitzie Later, MD Department of Emergency Medicine [facility]

## 2024-11-19 NOTE — ED Notes (Addendum)
  Lab called and stated they did not receive a urine on this patient to run the GC/Chlamydia. Unable to further the testing. Asked EDP Dr. Emil, he said to call patient to tell him to come back. Patient did not answer the phone when attempting to call.
